# Patient Record
Sex: Female | Born: 1960 | Race: White | Hispanic: No | Marital: Married | Smoking: Never smoker
Health system: Southern US, Academic
[De-identification: ages and names within clinical notes are randomized; demographics above are authoritative.]

## PROBLEM LIST (undated history)

## (undated) DIAGNOSIS — E119 Type 2 diabetes mellitus without complications: Secondary | ICD-10-CM

## (undated) DIAGNOSIS — I10 Essential (primary) hypertension: Secondary | ICD-10-CM

## (undated) DIAGNOSIS — M129 Arthropathy, unspecified: Secondary | ICD-10-CM

## (undated) DIAGNOSIS — N189 Chronic kidney disease, unspecified: Secondary | ICD-10-CM

## (undated) HISTORY — PX: GALLBLADDER SURGERY: SHX652

## (undated) HISTORY — PX: HX BACK SURGERY: SHX140

---

## 1999-02-07 ENCOUNTER — Emergency Department (HOSPITAL_COMMUNITY): Payer: Self-pay

## 2007-02-12 IMAGING — MG MAMMO SCREEN W CAD
1 series · 4 of 4 positions shown · non-contrast
Comparison: 06/12/05 and 07/17/01.

------------- REPORT [id number] -------------
Broughton, Fouzia

[place], [place]
HISTORY: Asymptomatic 46 year-old with family history of breast cancer in her mother. Calculated lifetime breast cancer risk in this patient is 17% compared with 12% for control group.

[Series 2: R CC · right · 4 of 4 slices shown]
[im 1/4]
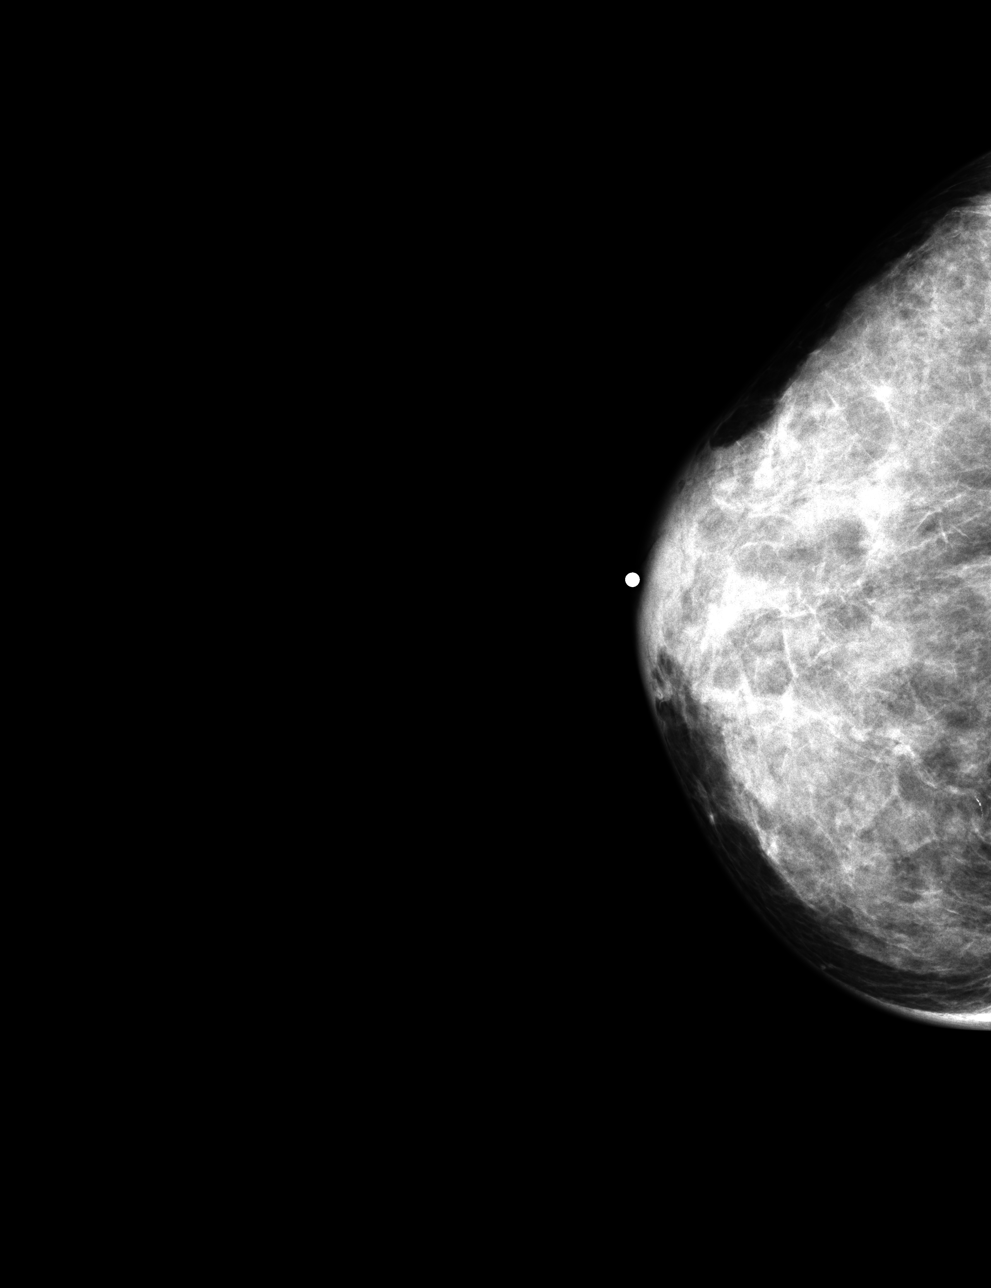
[im 2/4]
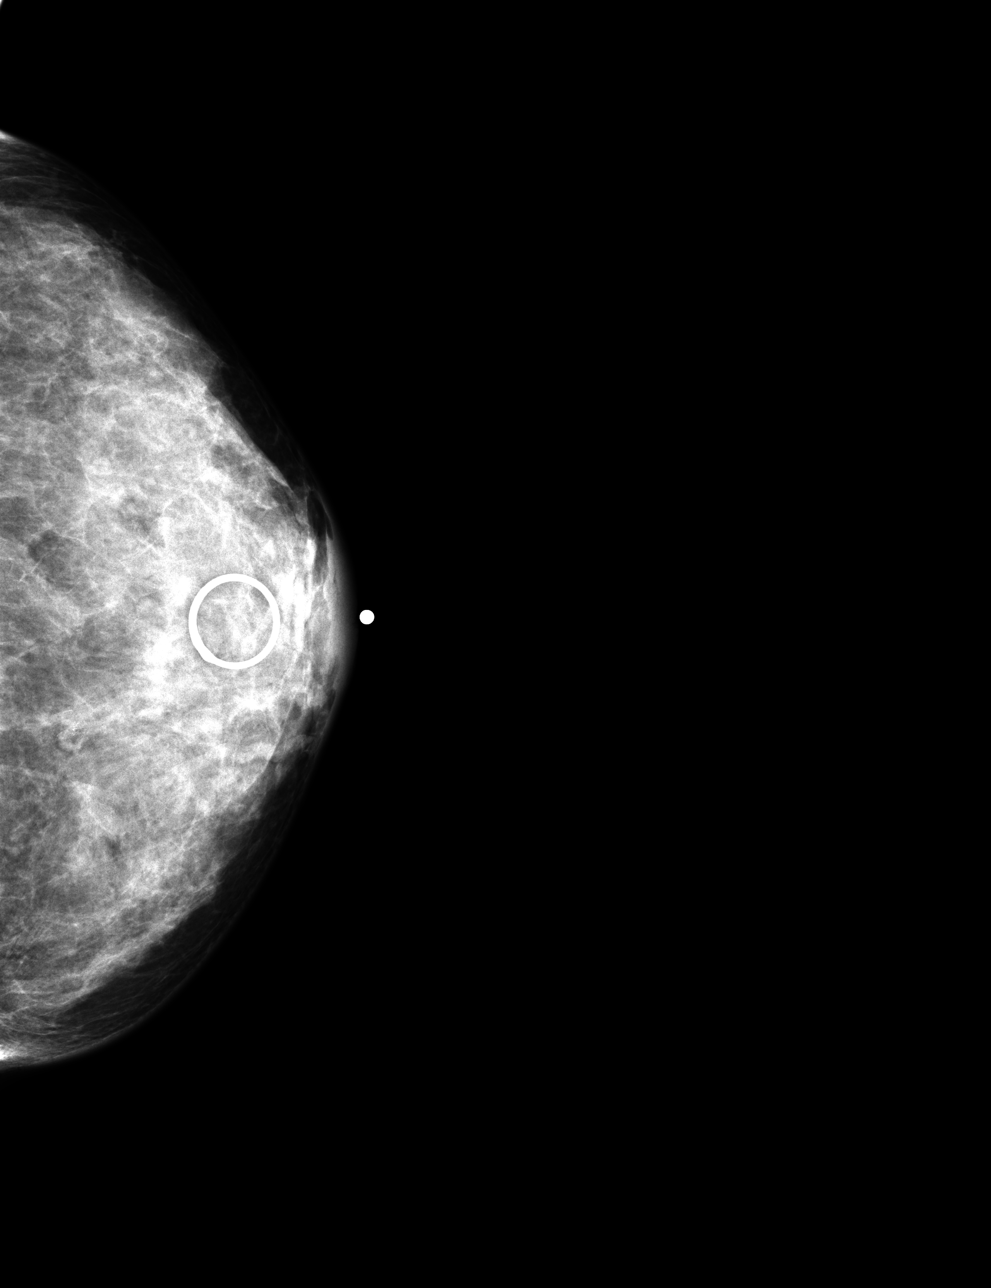
[im 3/4]
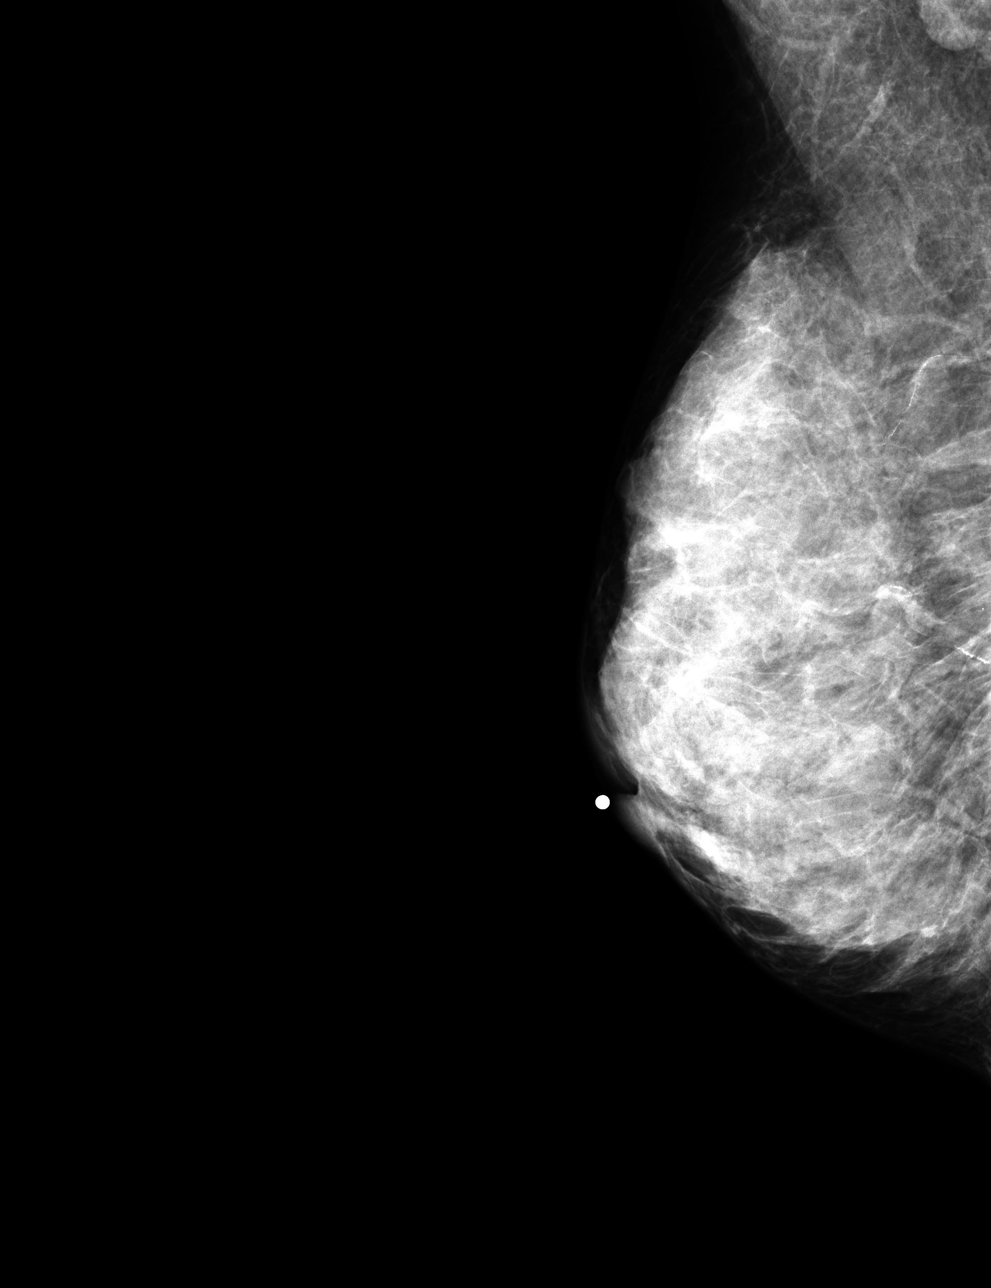
[im 4/4]
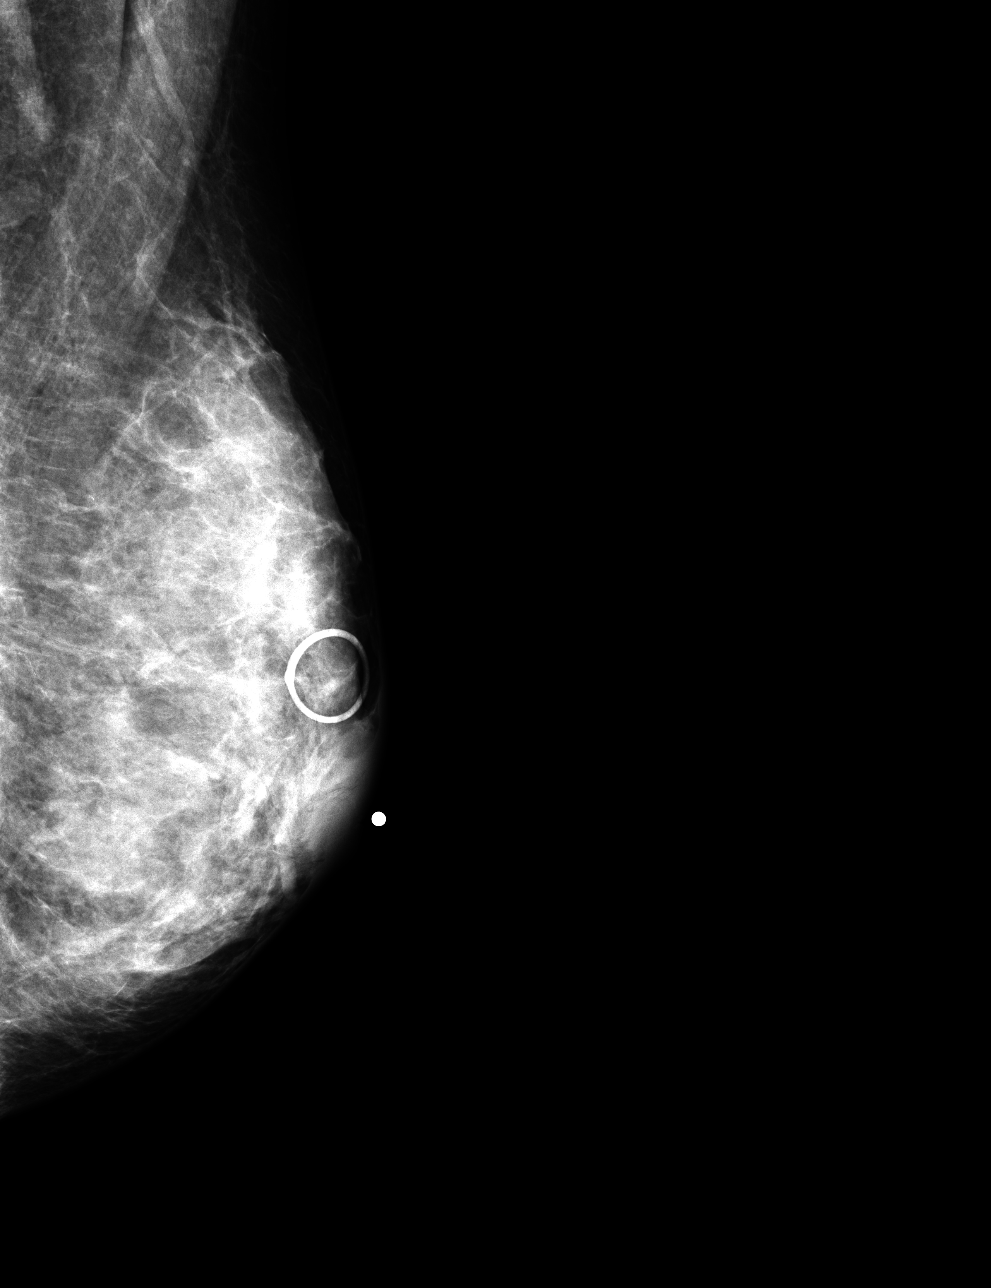

[4 of 4 positions shown; findings below may reference images not displayed]

FINDINGS: Bilaterally dense breasts limits sensitivity to the mammogram. No well defined masses or architectural changes are seen. Benign vascular calcifications of the right breast are stable in appearance. No skin changes or nipple changes are seen. 
NOTE:
In compliance with Federal regulations, the results of this mammogram are being sent to the patient.
IMPRESSION: Bilaterally dense breasts limits sensitivity to the mammogram. Mammographic findings nevertheless are stable from previous studies. Please correlate with clinical breast exam findings. 
Clinical follow up and mammographic follow up at twelve months are recommended. 

Final Assessment Code:
Bi-Rads 2 

BI-RADS 0
Need additional imaging evaluation
BI-RADS 1
Negative mammogram
BI-RADS 2
Benign finding
BI-RADS 3
Probably benign finding: short-interval follow-up suggested
BI-RADS 4
Suspicious abnormality:  biopsy should be considered
BI-RADS 5
Highly suggestive of malignancy; appropriate action should be taken

________________________________
Dele Tiger., signed this document electronically

------------- REPORT [id number] -------------
Caudle, Joscelyn
[id number] 

Normal/Negative.  No evidence of cancer.

Interpreting Radiologist:

Boniface Aujla, M.D.

Annual Breast Examination by a physician or other health care provider
Annual Mammography Screening beginning at age 40
Monthly Breast Self Examination

## 2008-03-02 IMAGING — MG MAMMO SCREEN W CAD
1 series · 4 of 4 positions shown · non-contrast
Comparison: 02/12/2007.

Noyola, Nefi

SCREENING BILATERAL MAMMOGRAM, 03/02/2008
HISTORY: Screening.
TECHNIQUE: Digital mammography was performed with CAD.

[Series 2: R CC · right · 4 of 4 slices shown]
[im 1/4]
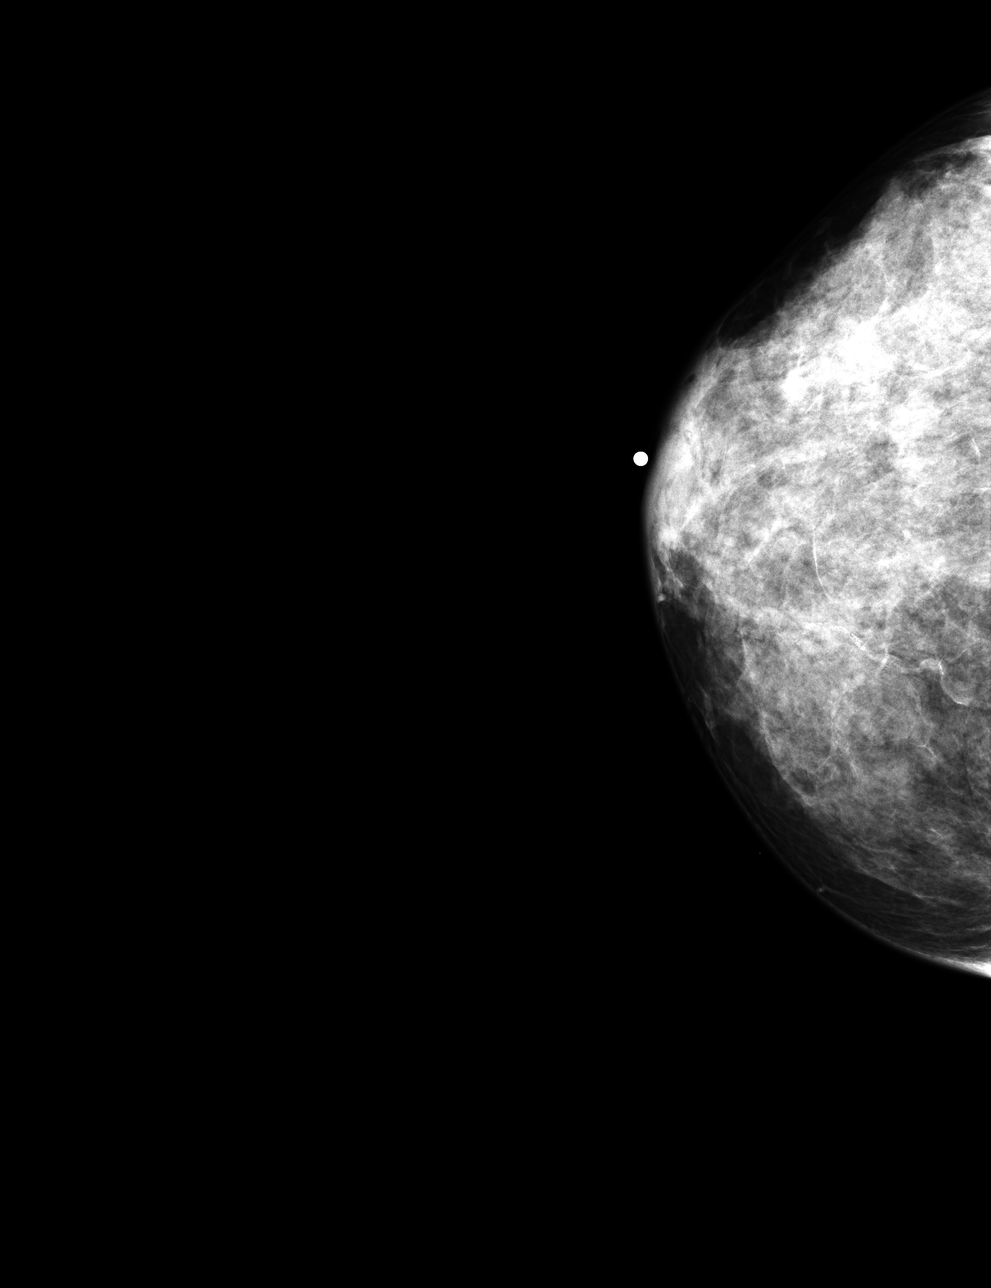
[im 2/4]
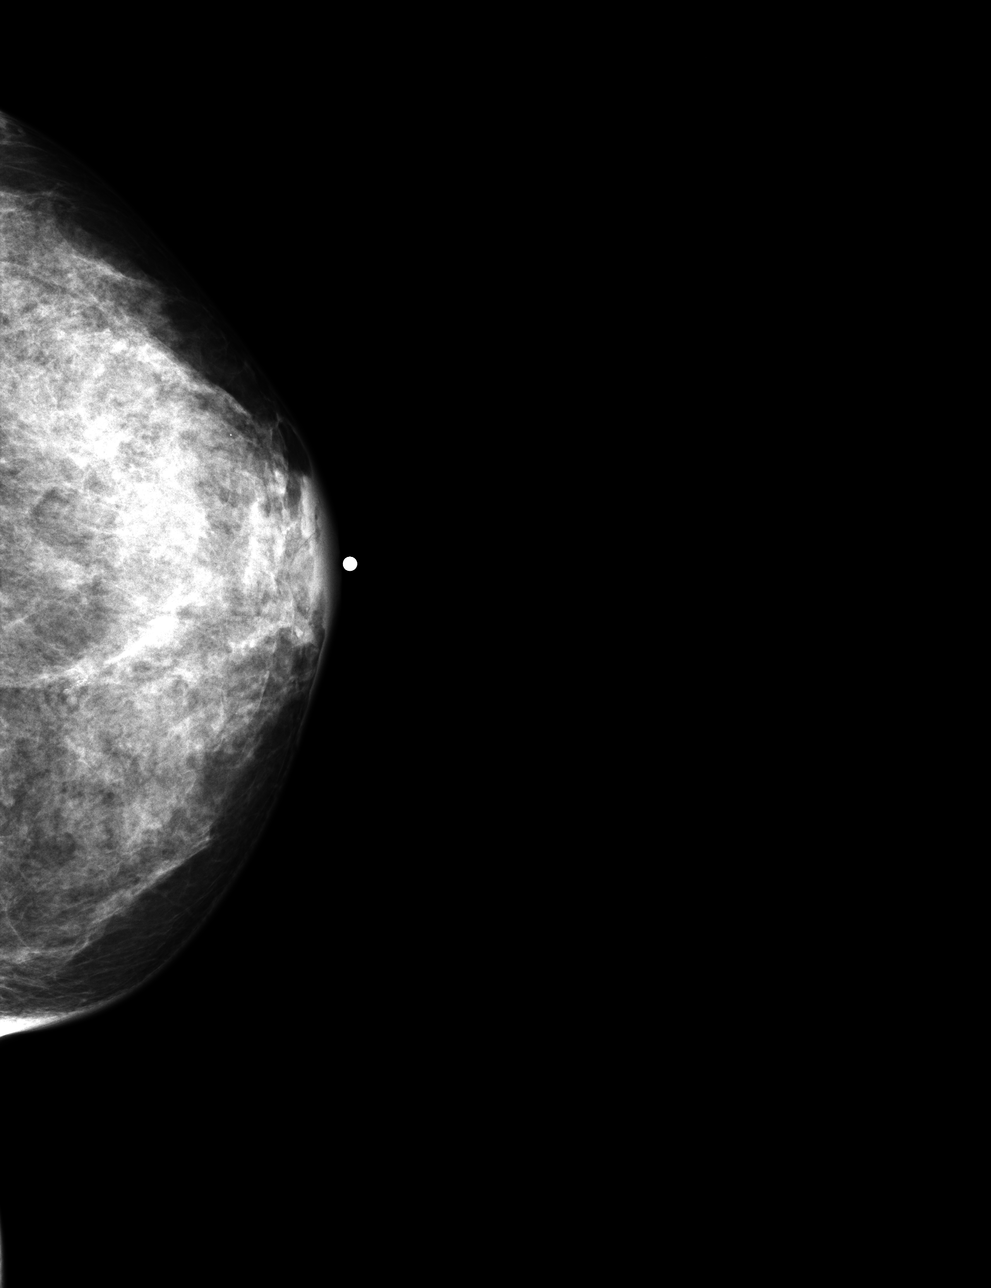
[im 3/4]
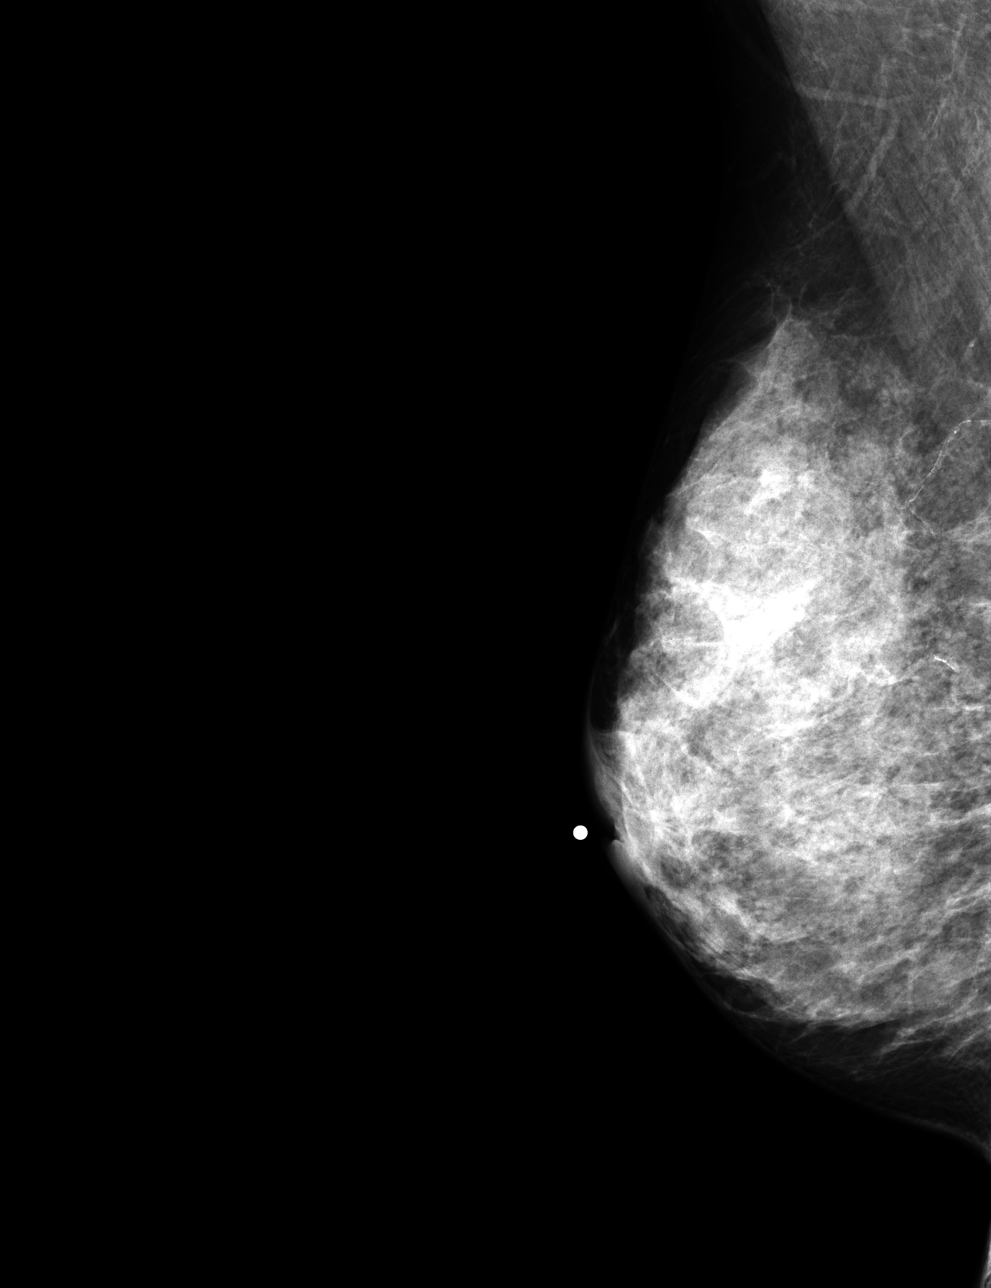
[im 4/4]
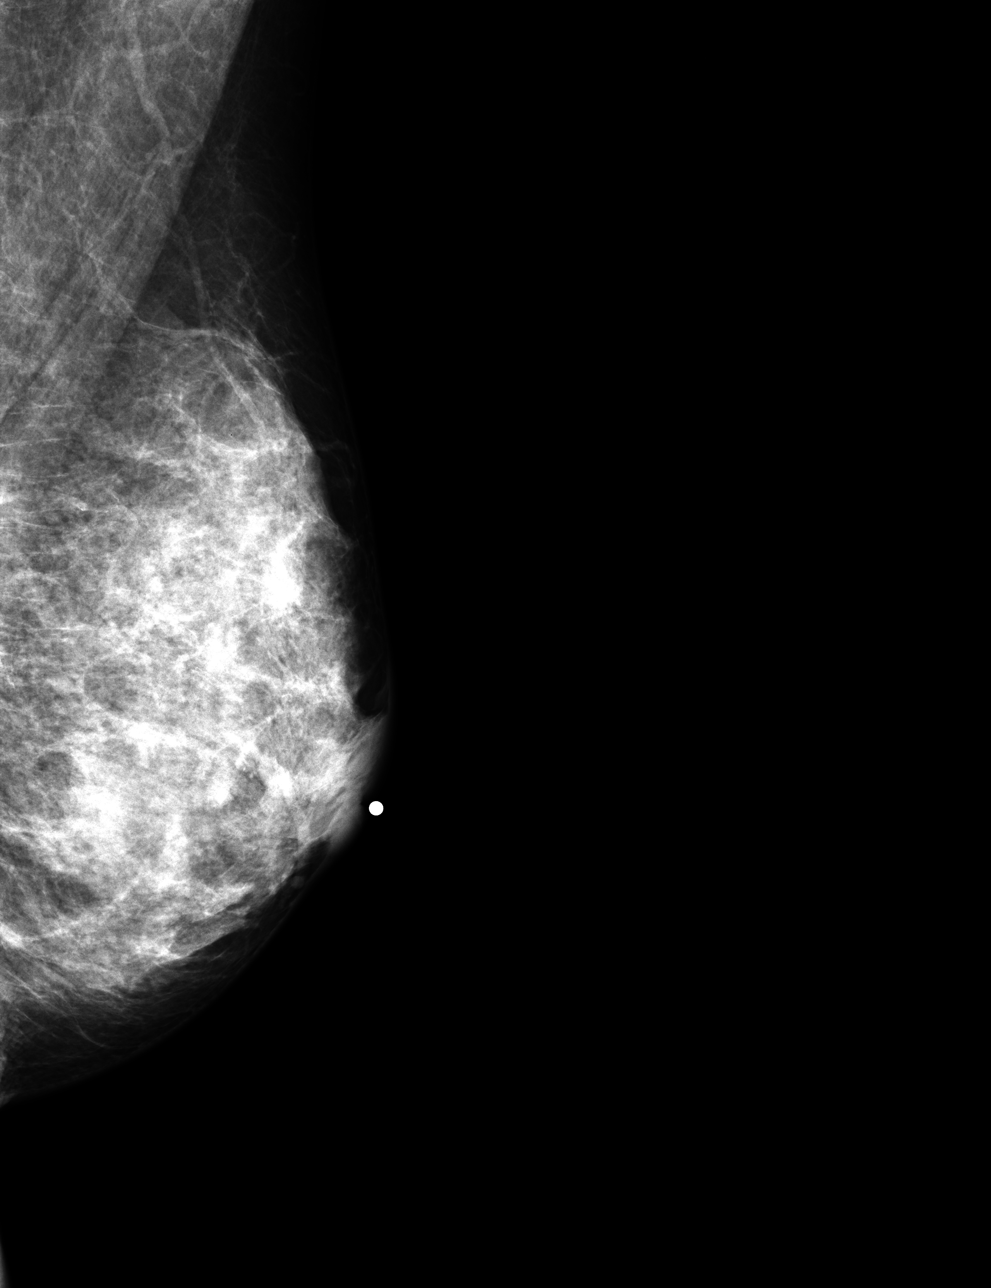

[4 of 4 positions shown; findings below may reference images not displayed]

FINDINGS: There is dense breast parenchyma that lowers the sensitivity of mammography for the detection of breast cancer.  The parenchyma is seen in a roughly symmetrical distribution.  Benign appearing calcifications are noted bilaterally.  No suspicious microcalcifications, malignant appearing masses, or spiculated lesions are seen.  No significant change is evident compared to prior films dated  
NOTE:
In compliance with Federal regulations, the results of this mammogram are being sent to the patient.
IMPRESSION: No radiographic evidence of malignancy.
Final Overall Assessment Category:

Bi-Rads 2:  Benign.

BI-RADS 0
Need additional imaging evaluation
BI-RADS 1
Negative mammogram
BI-RADS 2
Benign finding
BI-RADS 3
Probably benign finding: short-interval follow-up suggested
BI-RADS 4
Suspicious abnormality:  biopsy should be considered
BI-RADS 5
Highly suggestive of malignancy; appropriate action should be taken. 

________________________________

## 2009-01-11 IMAGING — US GYN
1 series · 14 of 16 positions shown · non-contrast
Comparison: none

Libre, Abdemalik

EXAM:
PELVIC ULTRASOUND
HISTORY: Anemia
TECHNIQUE: Transabdominal and transvaginal examination were performed.

[Series 1: gyn · 0.33mm/px · 14 of 87 slices shown]
[im 1/87]
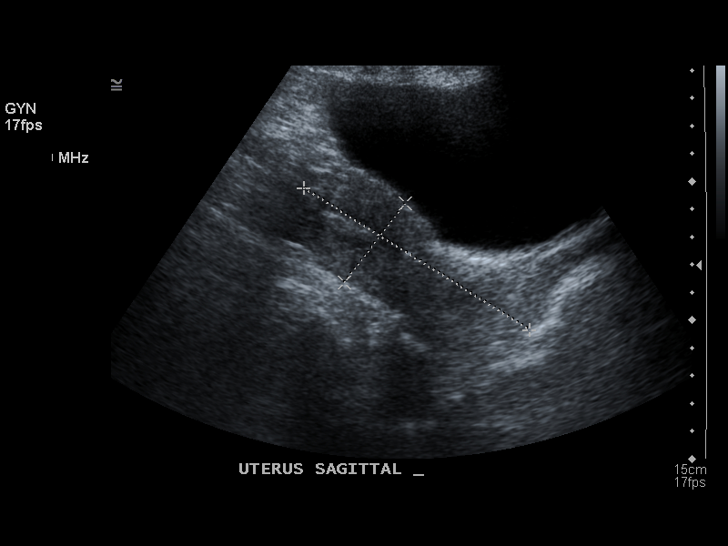
[im 6/87]
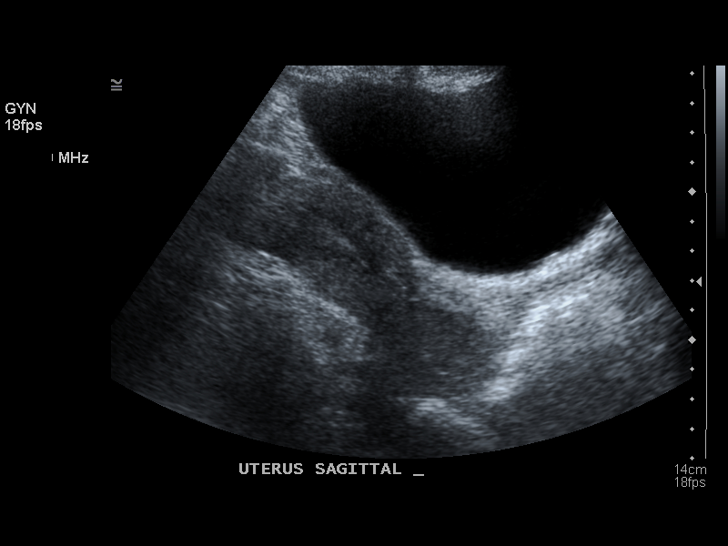
[im 12/87]
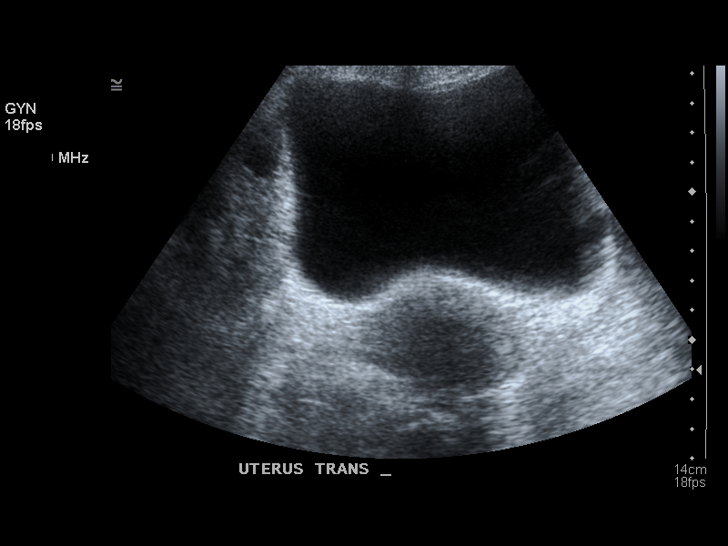
[im 23/87]
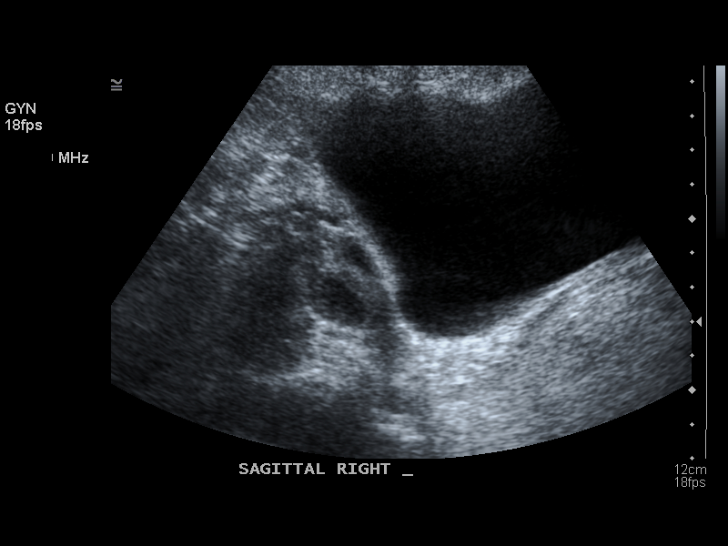
[im 29/87]
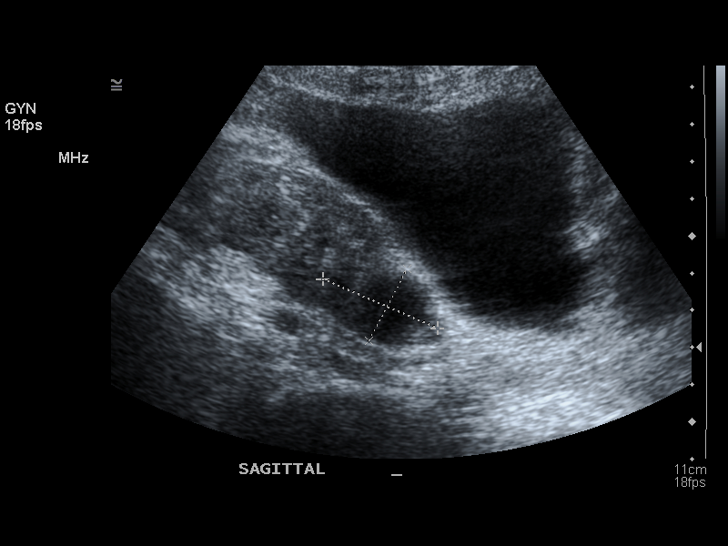
[im 35/87]
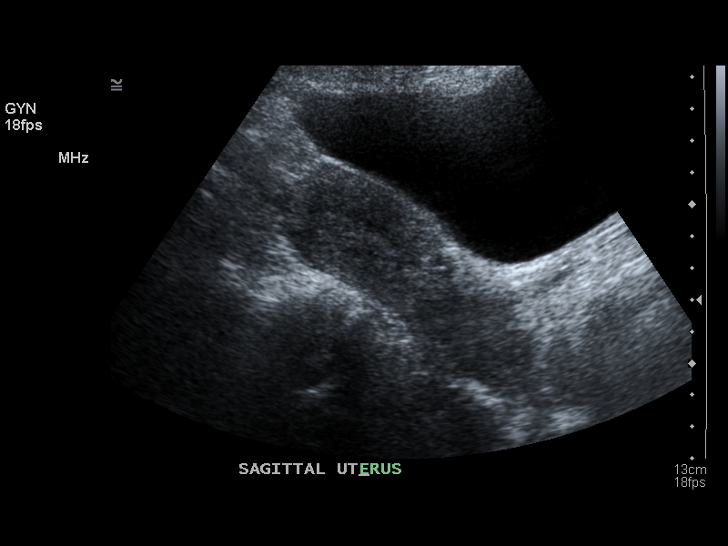
[im 41/87]
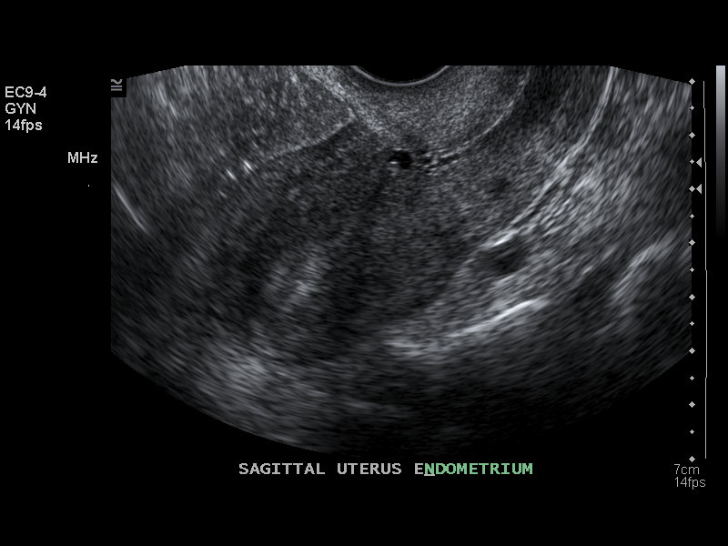
[im 46/87]
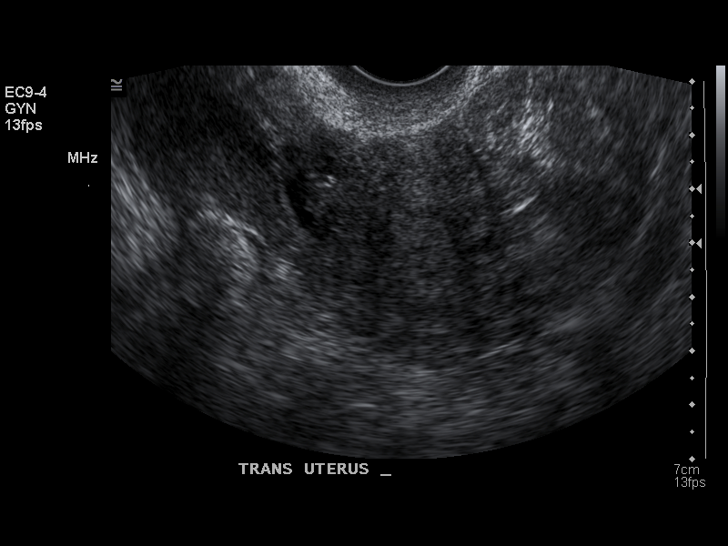
[im 52/87]
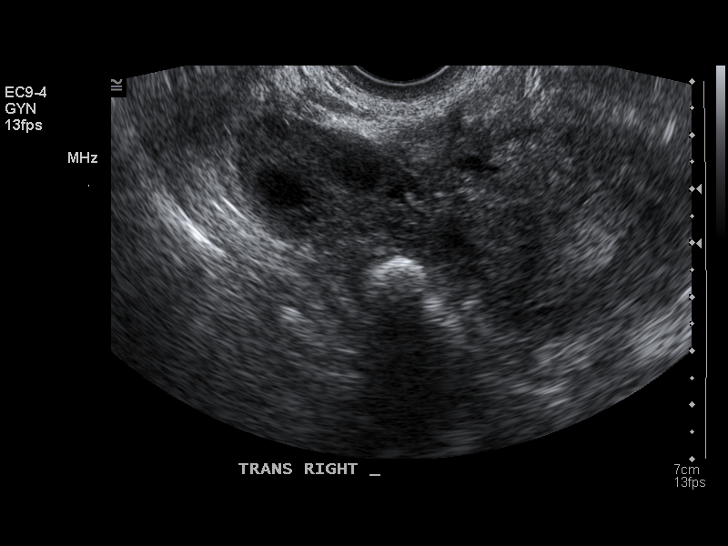
[im 58/87]
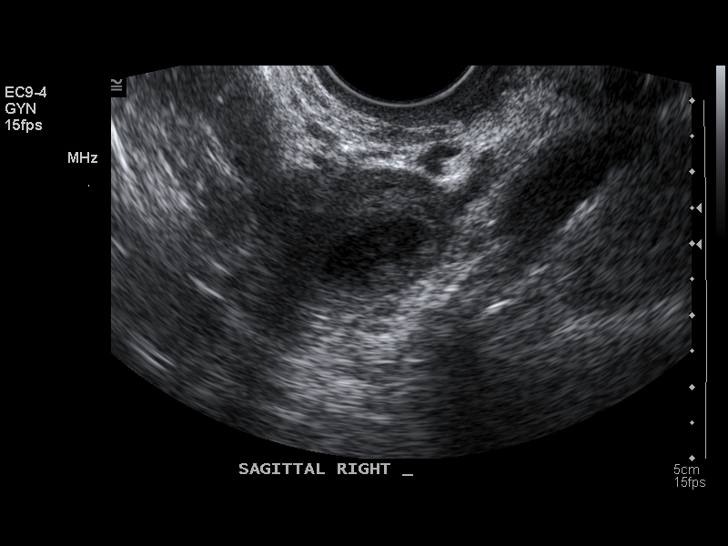
[im 69/87]
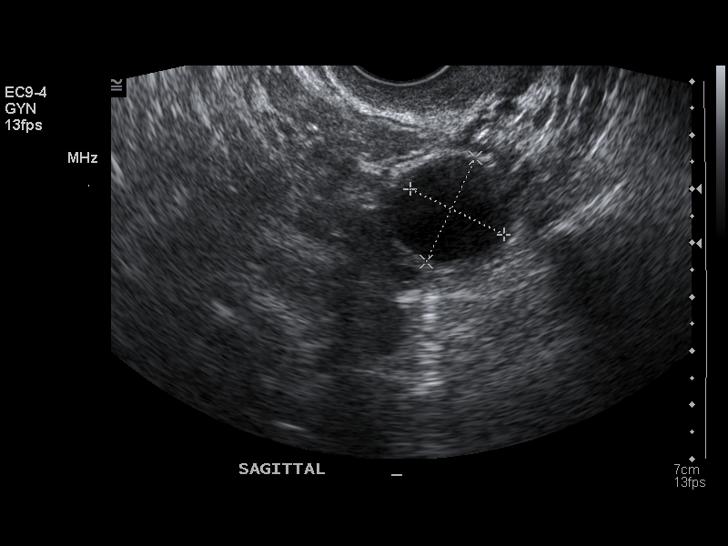
[im 75/87]
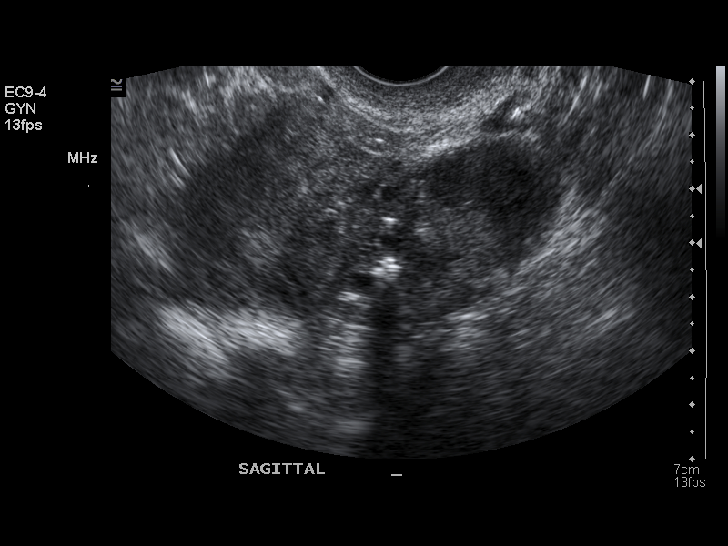
[im 81/87]
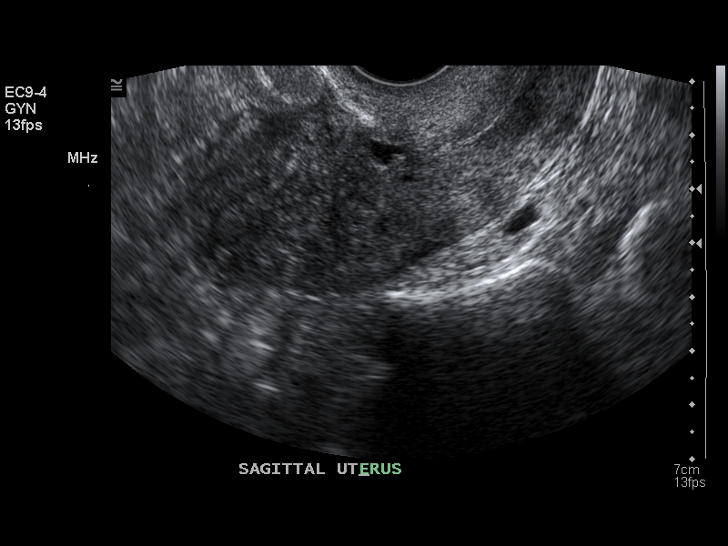
[im 87/87]
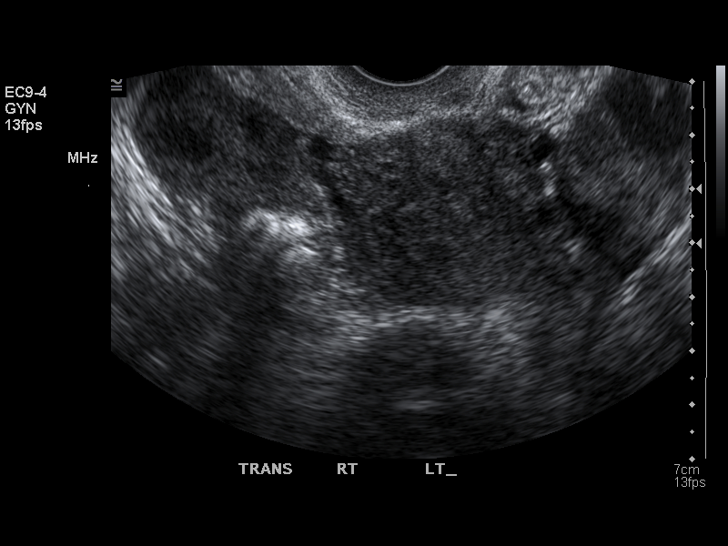

[14 of 16 positions shown; findings below may reference images not displayed]

FINDINGS: The uterus appears normal in overall size with a normal-appearing endometrium.  A probable small 8 mm fibroid is noted in the uterine fundus. 

Both ovaries appear within normal limits. No adnexal masses or free pelvic fluid is seen.
IMPRESSION: Small uterine fibroid. 
Otherwise unremarkable examination. 

________________________________

## 2009-04-06 IMAGING — MG MAMMO SCREEN W CAD
1 series · 4 of 4 positions shown · non-contrast
Comparison: Mammograms of 02/12/2007 and 03/02/2008.

Nausad, Mdnisad

Binod, Favaz
EXAM:
BILATERAL DIGITAL SCREENING MAMMOGRAM WITH COMPUTER-ASSISTED DIAGNOSIS
HISTORY: Asymptomatic 48-year-old with strong family history of breast cancer in her mother and sister.  Lifetime breast cancer risk in this patient is calculated at 33% compared with 12% in controlled group.

[Series 2: R CC · right · 4 of 4 slices shown]
[im 1/4]
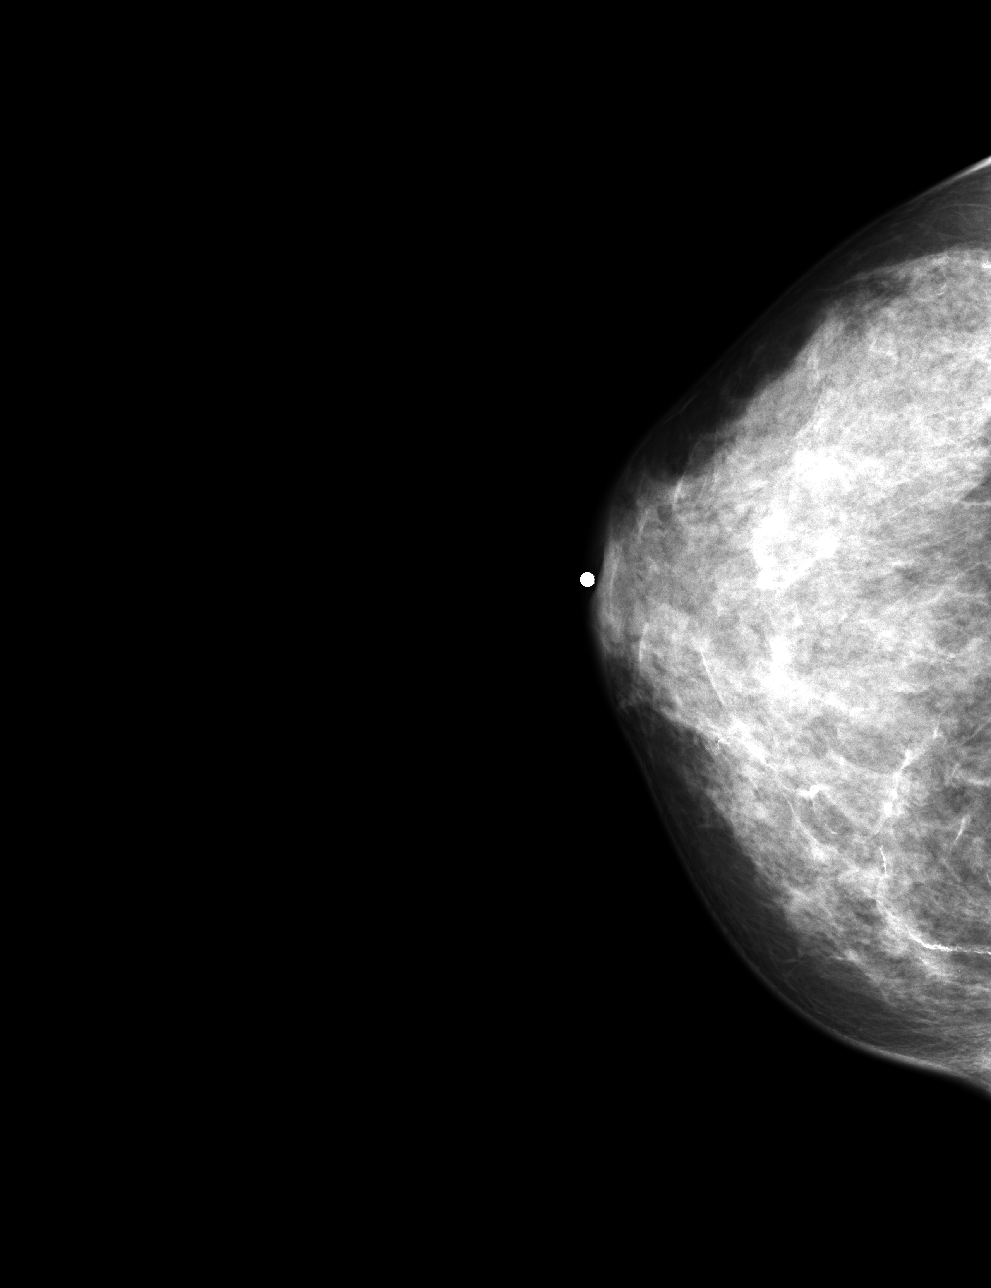
[im 2/4]
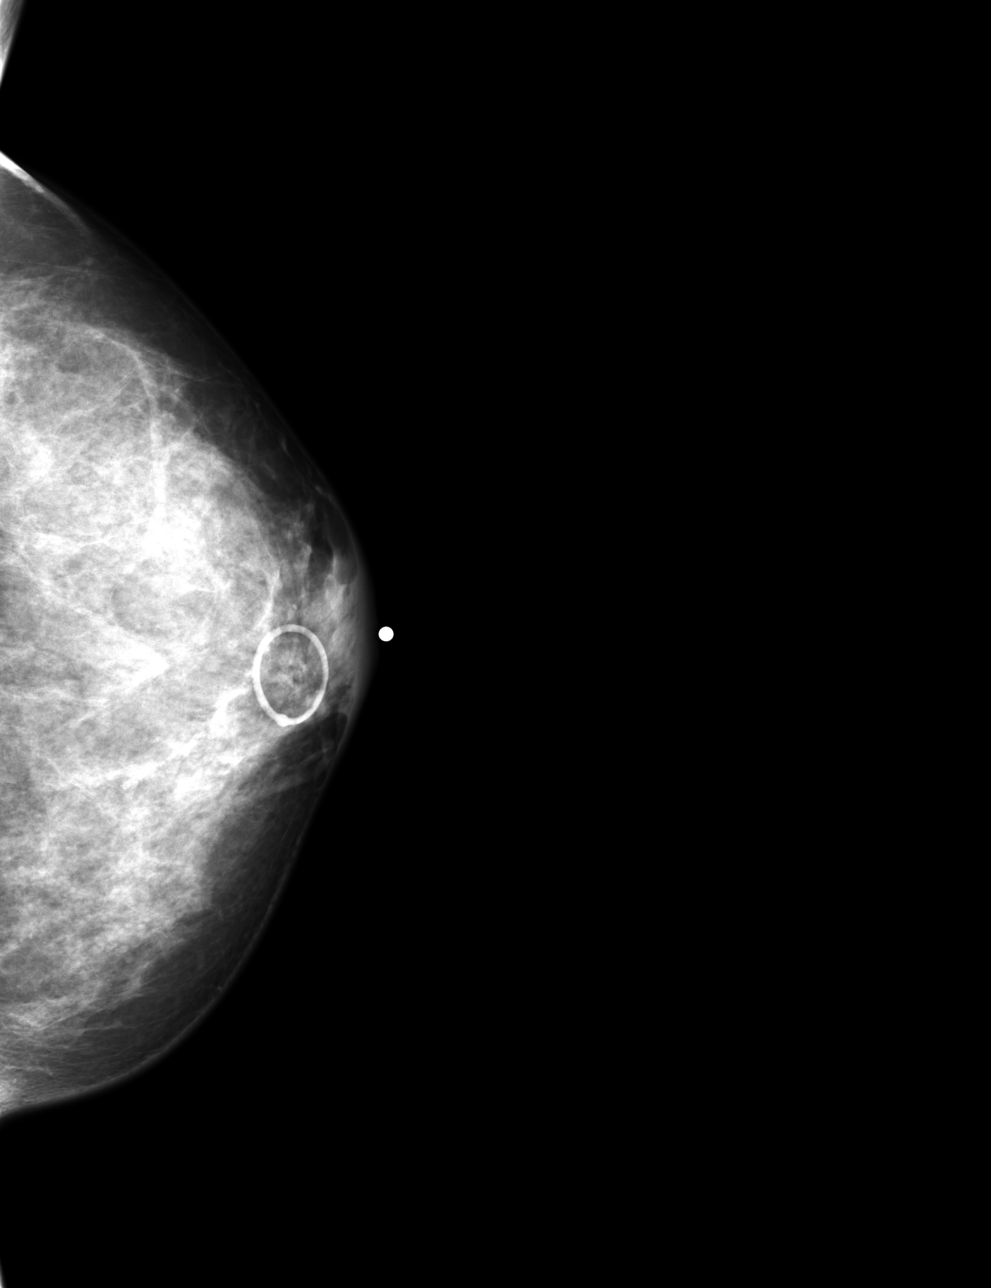
[im 3/4]
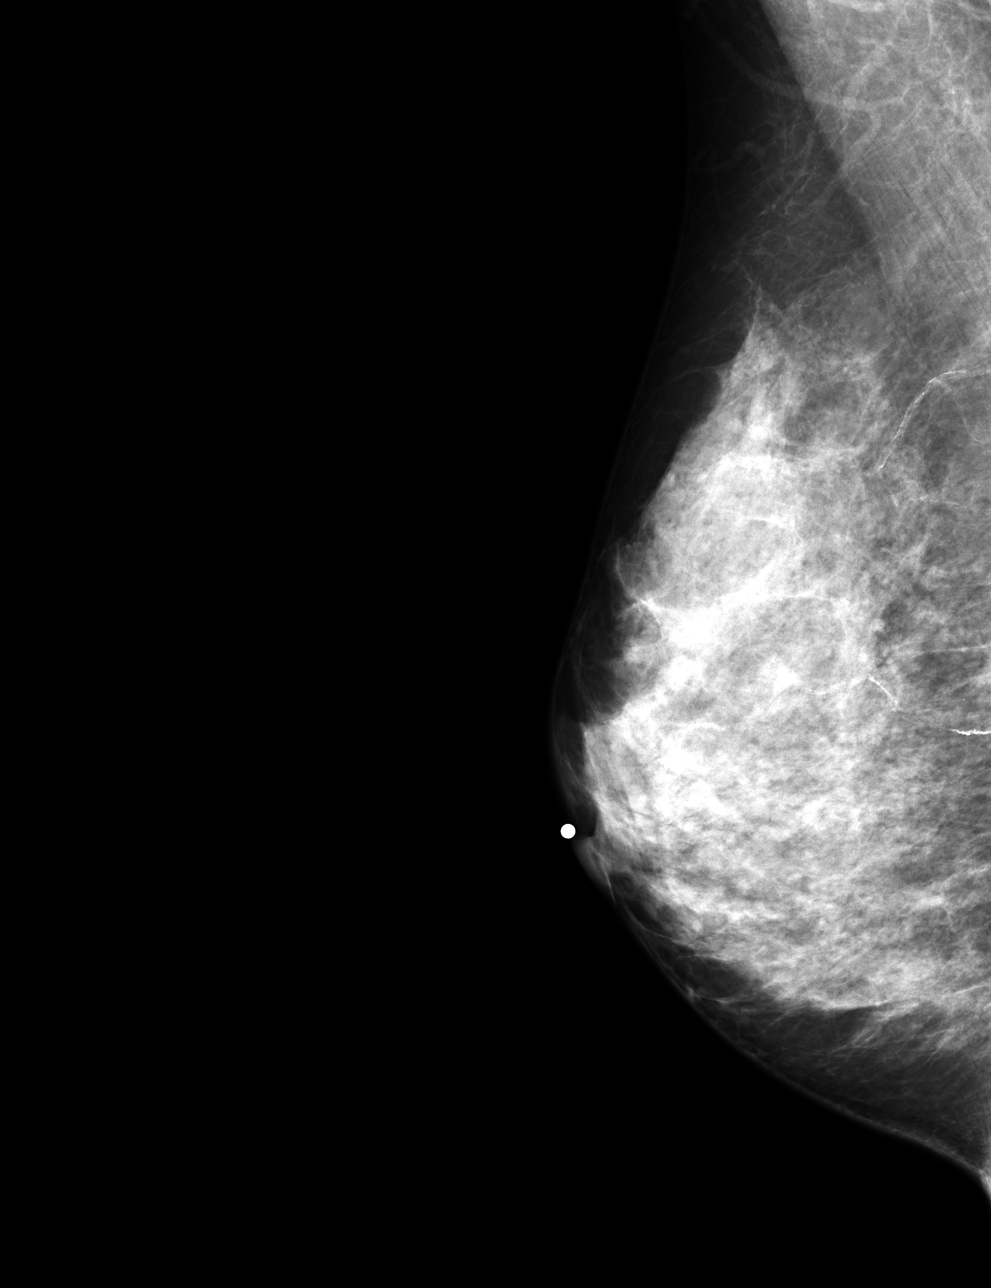
[im 4/4]
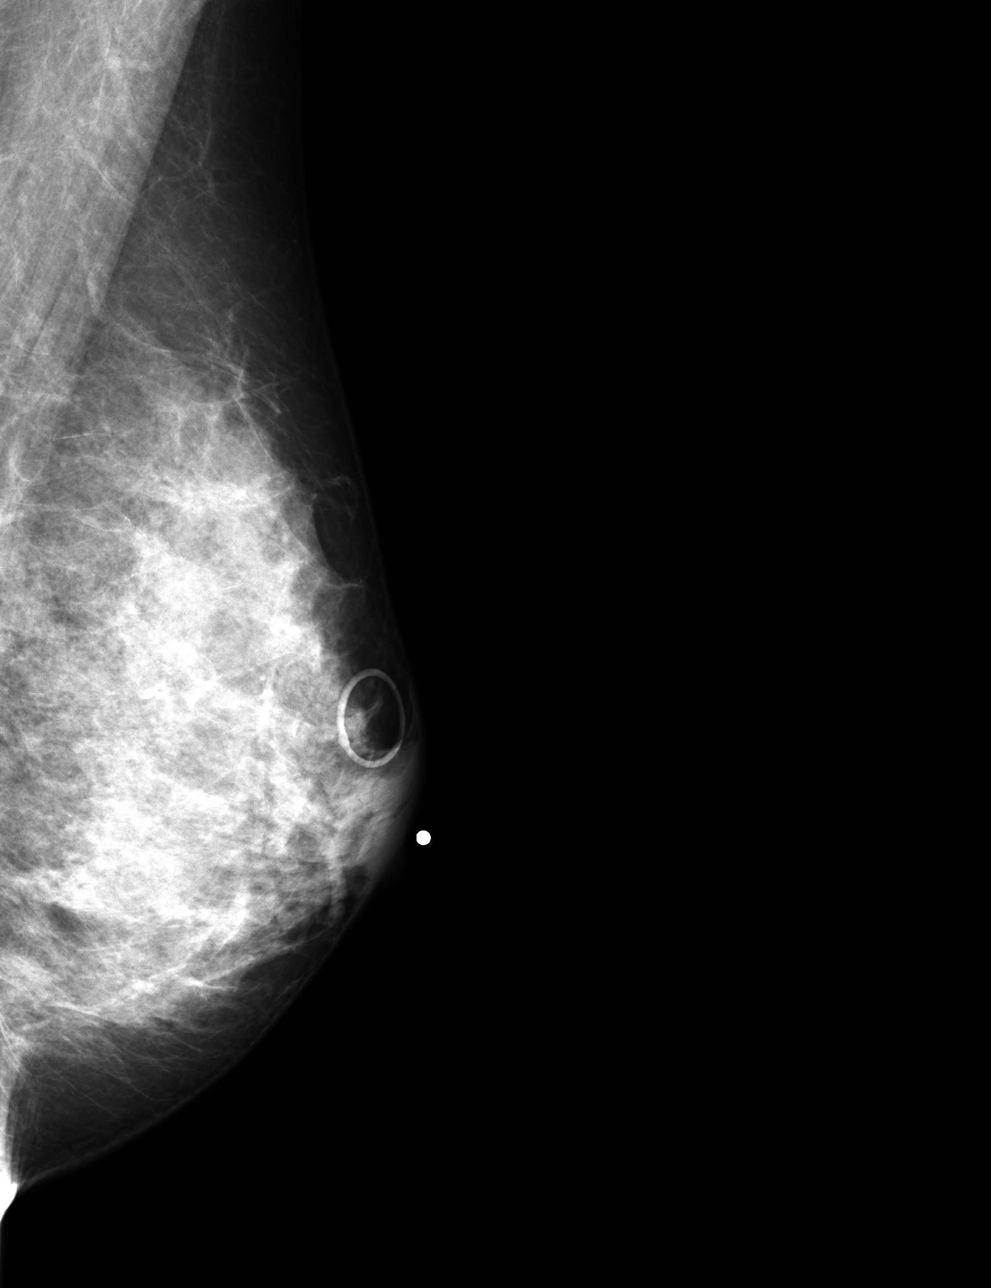

[4 of 4 positions shown; findings below may reference images not displayed]

FINDINGS: Significantly dense breasts limit the sensitivity to mammogram in this patient.  No well defined masses or architectural changes are seen.  Vascular calcifications of both breasts are stable.  No skin changes or nipple changes are seen.  

NOTE:

In compliance with Federal regulations, the results of this mammogram are being sent to the patient.
IMPRESSION: Final Assessment Code:

BI-RADS 2:
BI-RADS 0
Need additional imaging evaluation
BI-RADS 1
Negative mammogram
BI-RADS 2
Benign finding
BI-RADS 3
Probably benign finding - short interval follow-up suggested
BI-RADS 4
Suspicious abnormality: biopsy should be considered
BI-RADS 5
Highly suggestive of malignancy; appropriate action should be taken

________________________________

## 2009-09-13 IMAGING — CT NECK^NECK_WITH (ADULT)
3 series · 16 of 33 positions shown, 19 images · IV contrast (APPLIED)
Comparison: none

Motaki, Kbor

Azagra, Dieusson
EXAM:
CT OF THE NECK WITH CONTRAST
INDICATION: Supraclavicular edema and right-sided neck pain.  
CONTRAST:
75 ml Optiray 300.

[Series 3: neck w axial · axial · 0.52mm/px · z∈[-239,-45]mm · 8 of 115 slices shown, 10 images]
[im 9/115  soft-tissue]
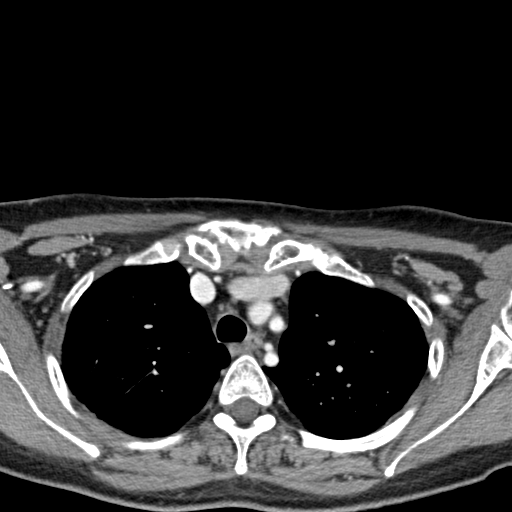
[im 9/115  bone]
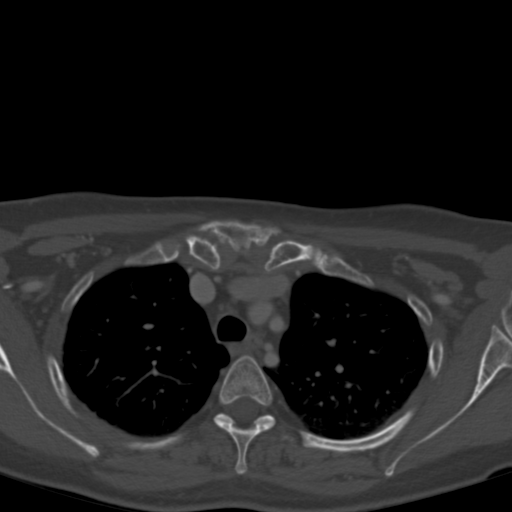
[im 27/115  bone]
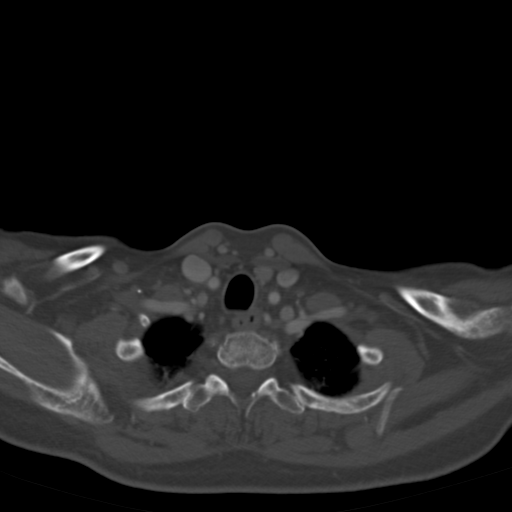
[im 36/115  bone]
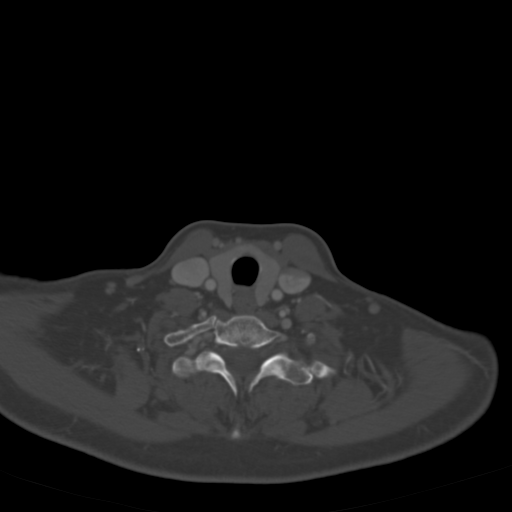
[im 53/115  bone]
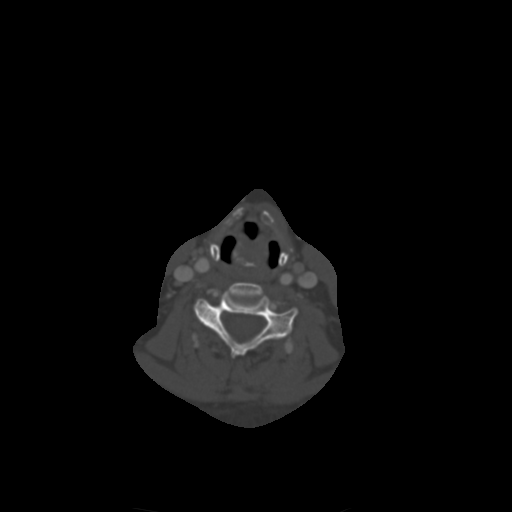
[im 62/115  soft-tissue]
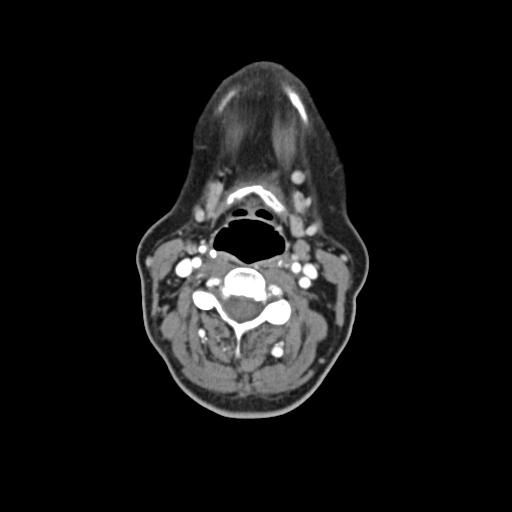
[im 62/115  bone]
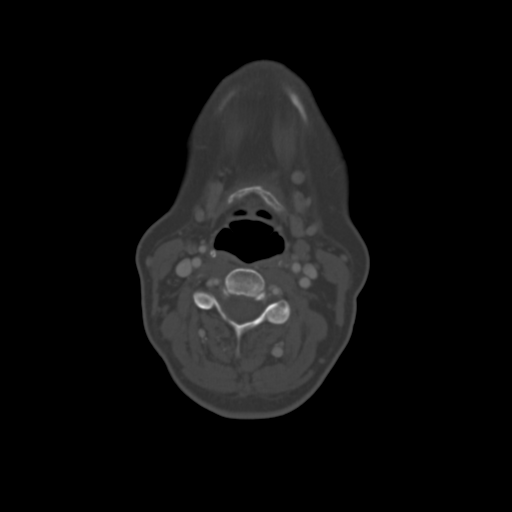
[im 79/115  bone]
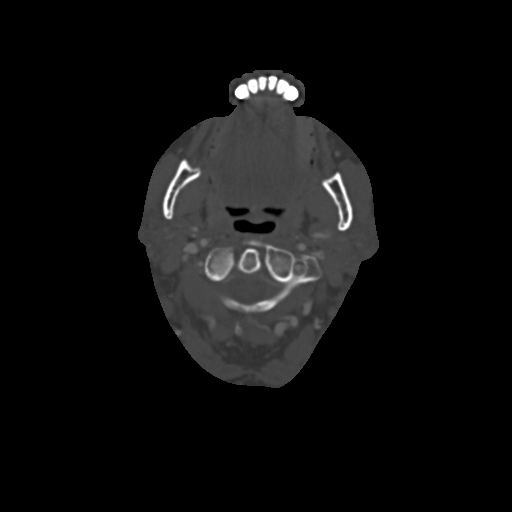
[im 88/115  bone]
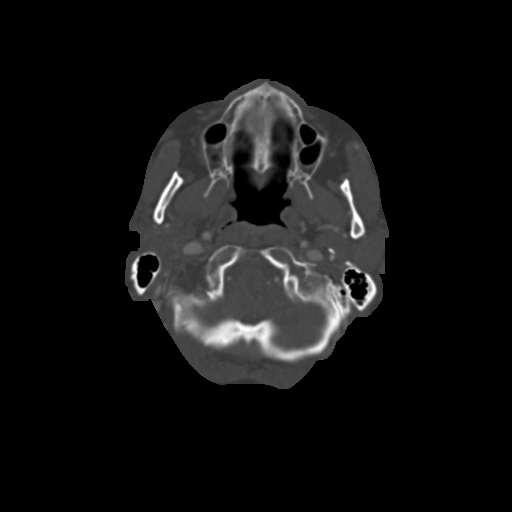
[im 106/115  bone]
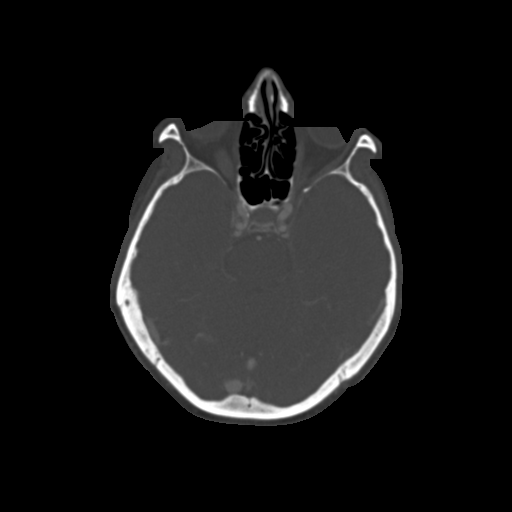

[Series 4: neck sag · sagittal · 0.59mm/px · 5 of 65 slices shown, 6 images]
[im 22/65  bone]
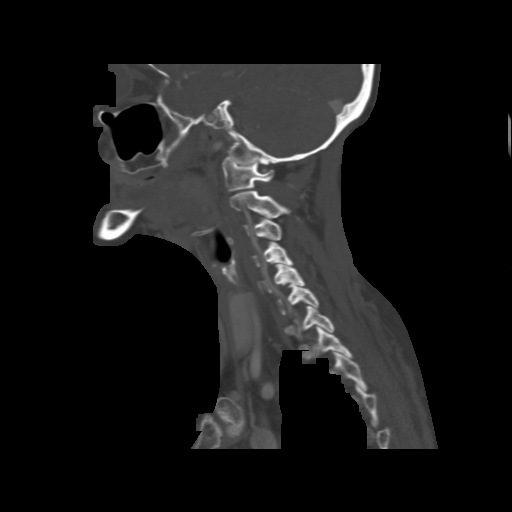
[im 27/65  bone]
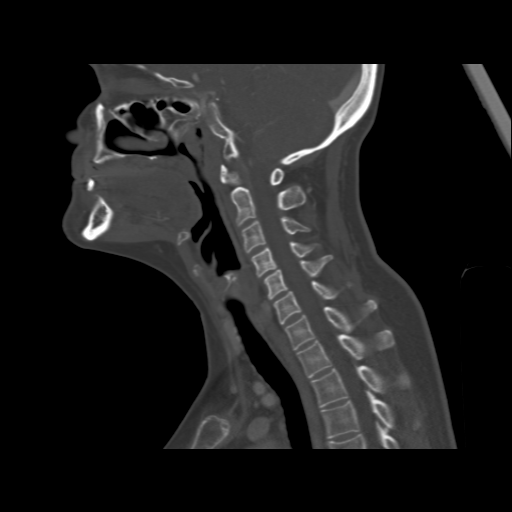
[im 33/65  soft-tissue]
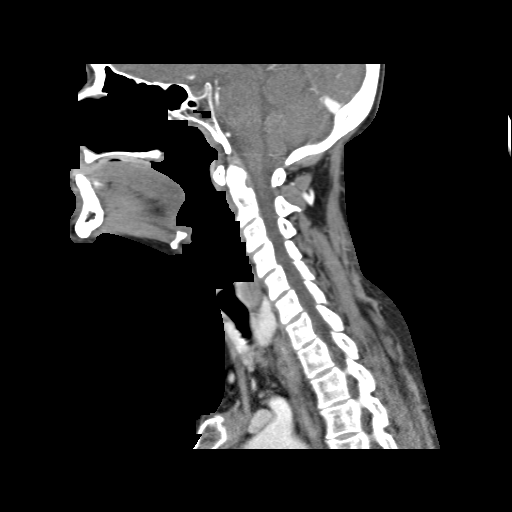
[im 33/65  bone]
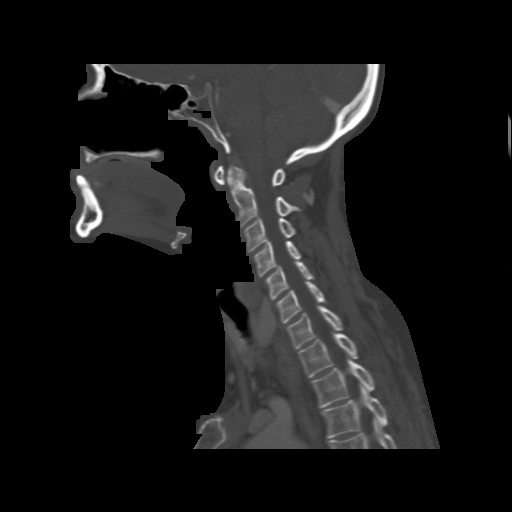
[im 38/65  bone]
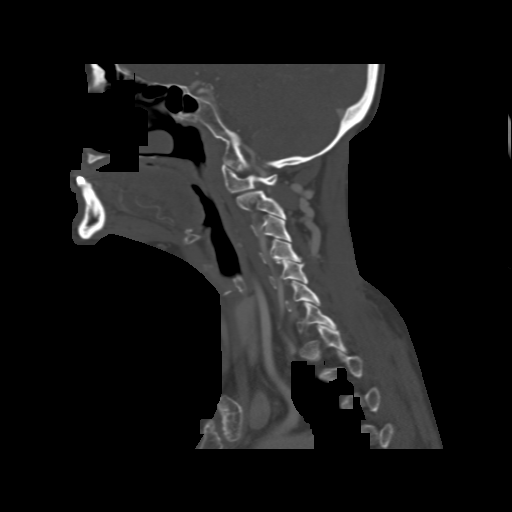
[im 43/65  bone]
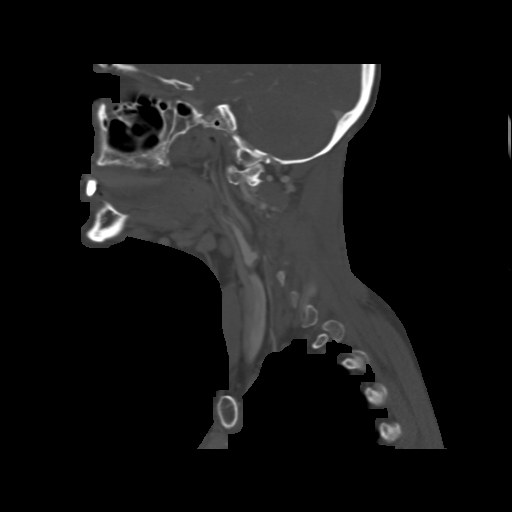

[Series 502: neck cor · coronal · 0.70mm/px · 3 of 79 slices shown]
[im 16/79  bone]
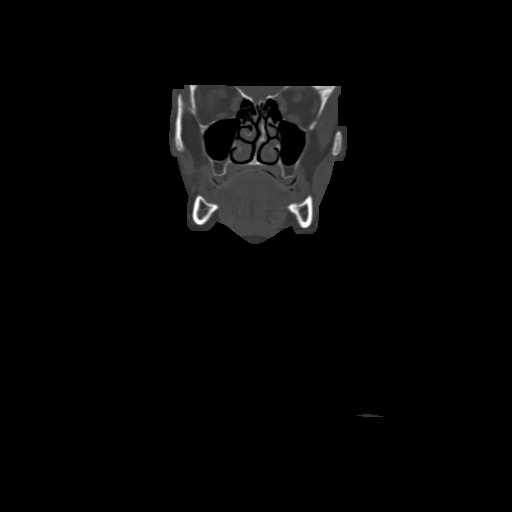
[im 32/79  bone]
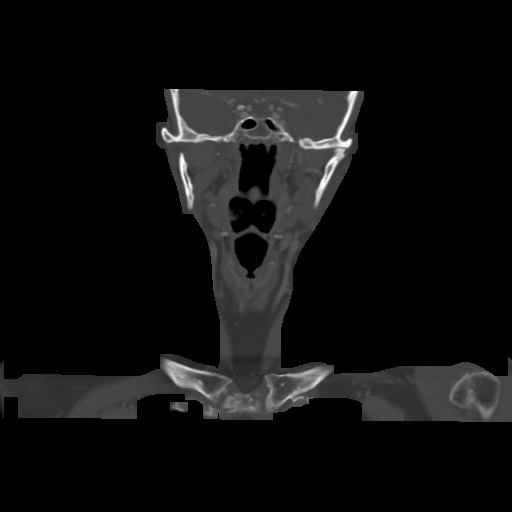
[im 47/79  bone]
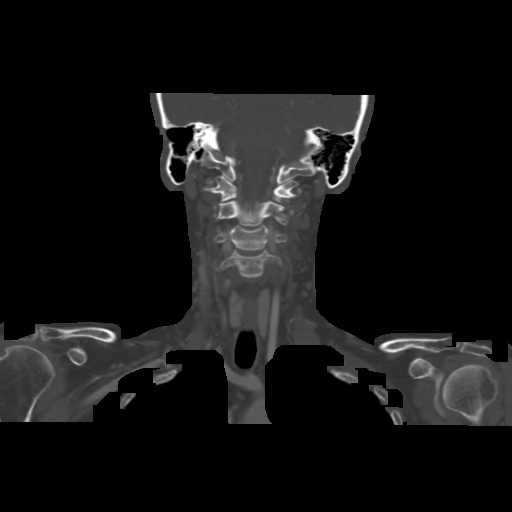

[16 of 33 positions shown; findings below may reference images not displayed]

FINDINGS: There is no acute cervical spine fracture or subluxation.  The vertebral bodies are normal in alignment and height.  Intervertebral disc spaces are fairly well maintained.  Though the study is not optimized for visualization of the spinal cord, there is no definite significant disc disease, canal stenosis, or foraminal narrowing.  

Evaluation of the soft tissues demonstrates scattered lymph nodes in the submandibular region extending into the jugulodigastric chains bilaterally.  The largest of these is in the right jugulodigastric, level II distribution, measuring 8 mm, and not pathology enlarged by size criteria.  

There is no significant soft tissue infiltration or collections.  No soft tissue masses are noted within the visceral space.  

Visualized thyroid gland is unremarkable.  

Parapharyngeal fat planes are well preserved.  

There is a four vessel branching pattern from the aortic arch with direct origin of the left vertebral artery.  Visualized carotid and vertebral systems are unremarkable with no evidence for significant stenosis or aneurysm.  

There is limited evaluation of the superior vena cava, but this appears grossly patent.  No significant collateral circulation is seen.
IMPRESSION: Scattered, non-pathologically enlarged lymph nodes bilaterally within the neck, the largest of which measures 8 mm in the right level II jugulodigastric distribution.  No dominant mass or collection is seen in the neck. 
Limited evaluation of cervical spine, canal, and foramina, however, no gross disc pathology or canal stenosis is seen.  
________________________________

## 2010-05-23 IMAGING — MG MAMMO SCREEN W CAD
1 series · 4 of 4 positions shown · non-contrast
Comparison: 04/06/09 and 03/02/08.

Rom, Faby

Hamann, Loubensky
INDICATION: Screening
ADDITIONAL HISTORY:
Patient denies previous breast surgery. She denies any current problems with her breasts. 
RISK FACTOR:
Positive family history of breast cancer (mother and sister).
TECHNIQUE: Bilateral MLO and CC digital images were obtained. This study was reviewed using iCAD 200 software.

[Series 2: R CC · right · 4 of 4 slices shown]
[im 1/4]
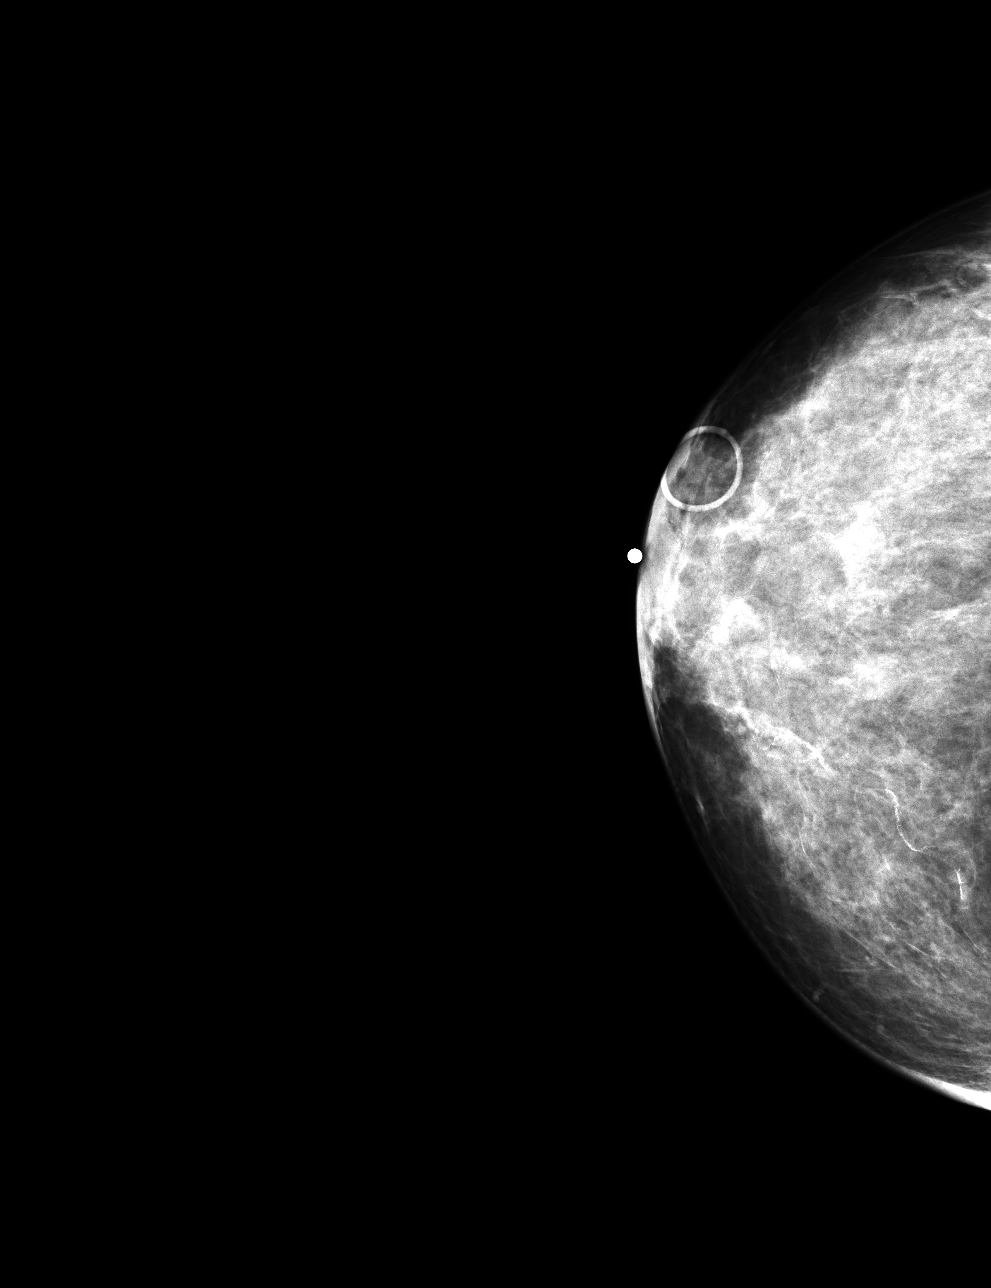
[im 2/4]
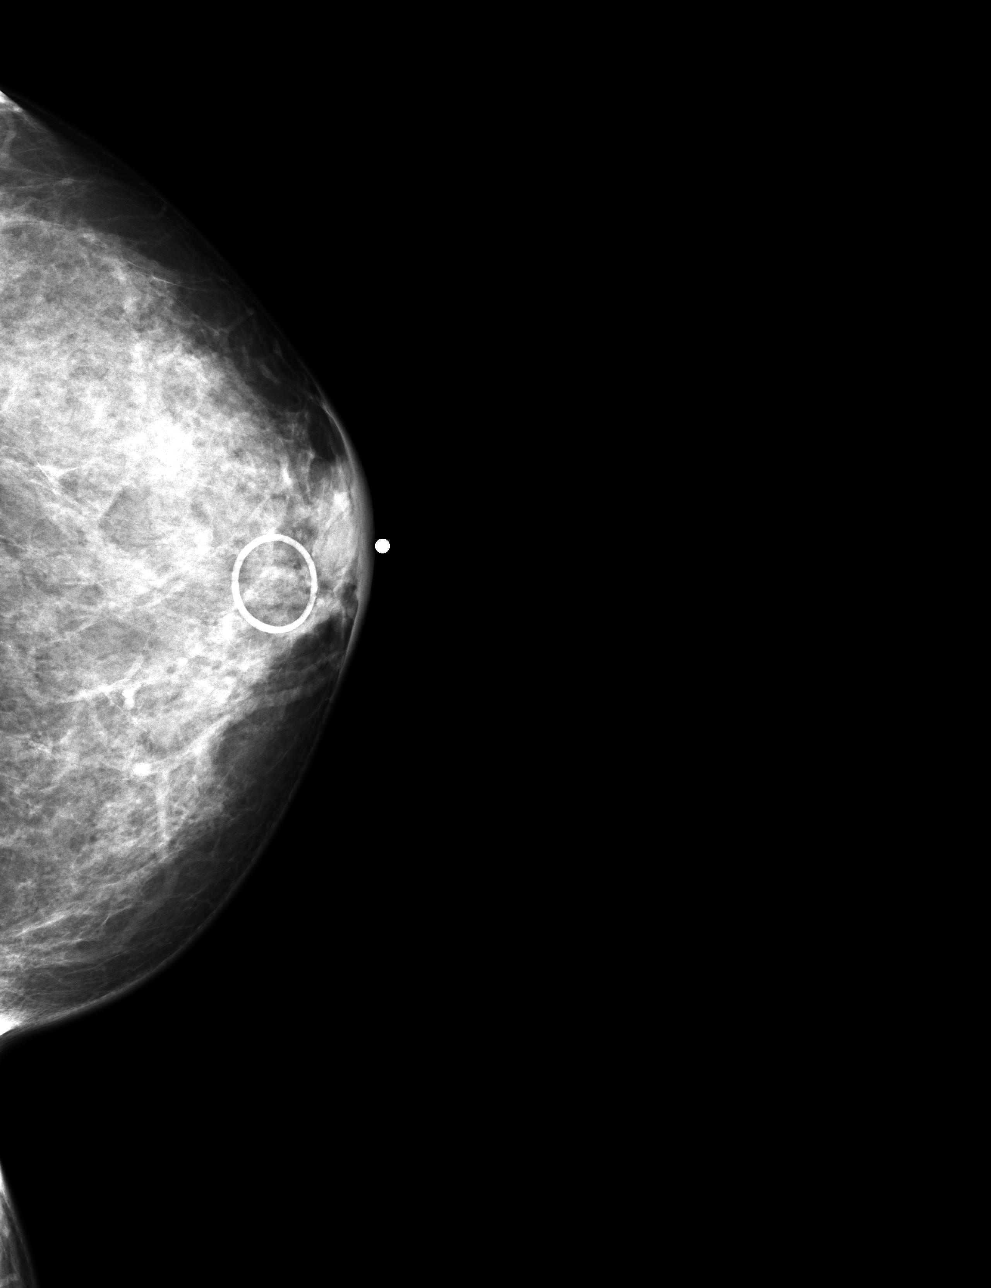
[im 3/4]
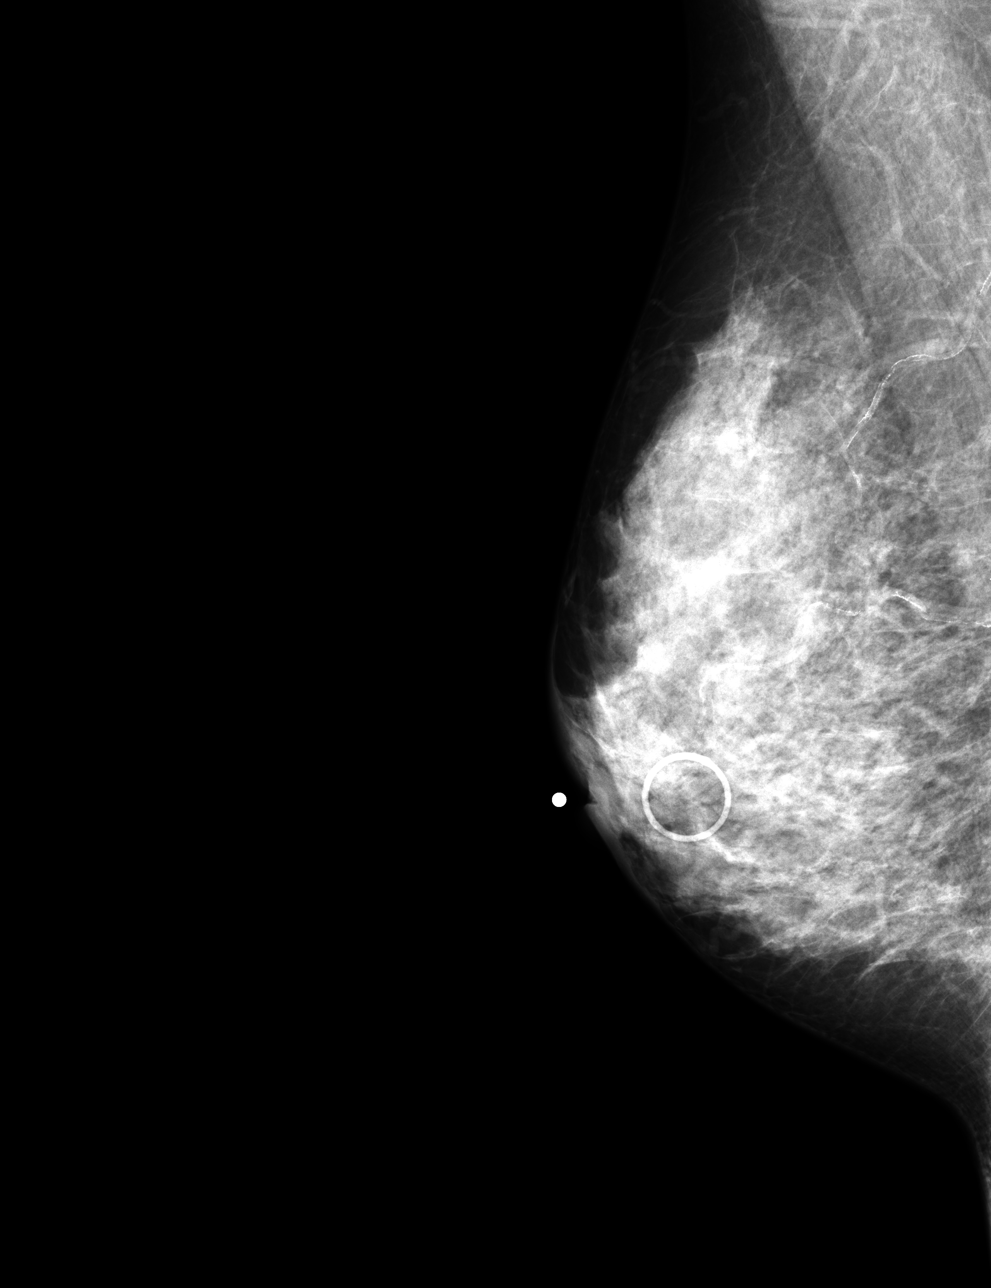
[im 4/4]
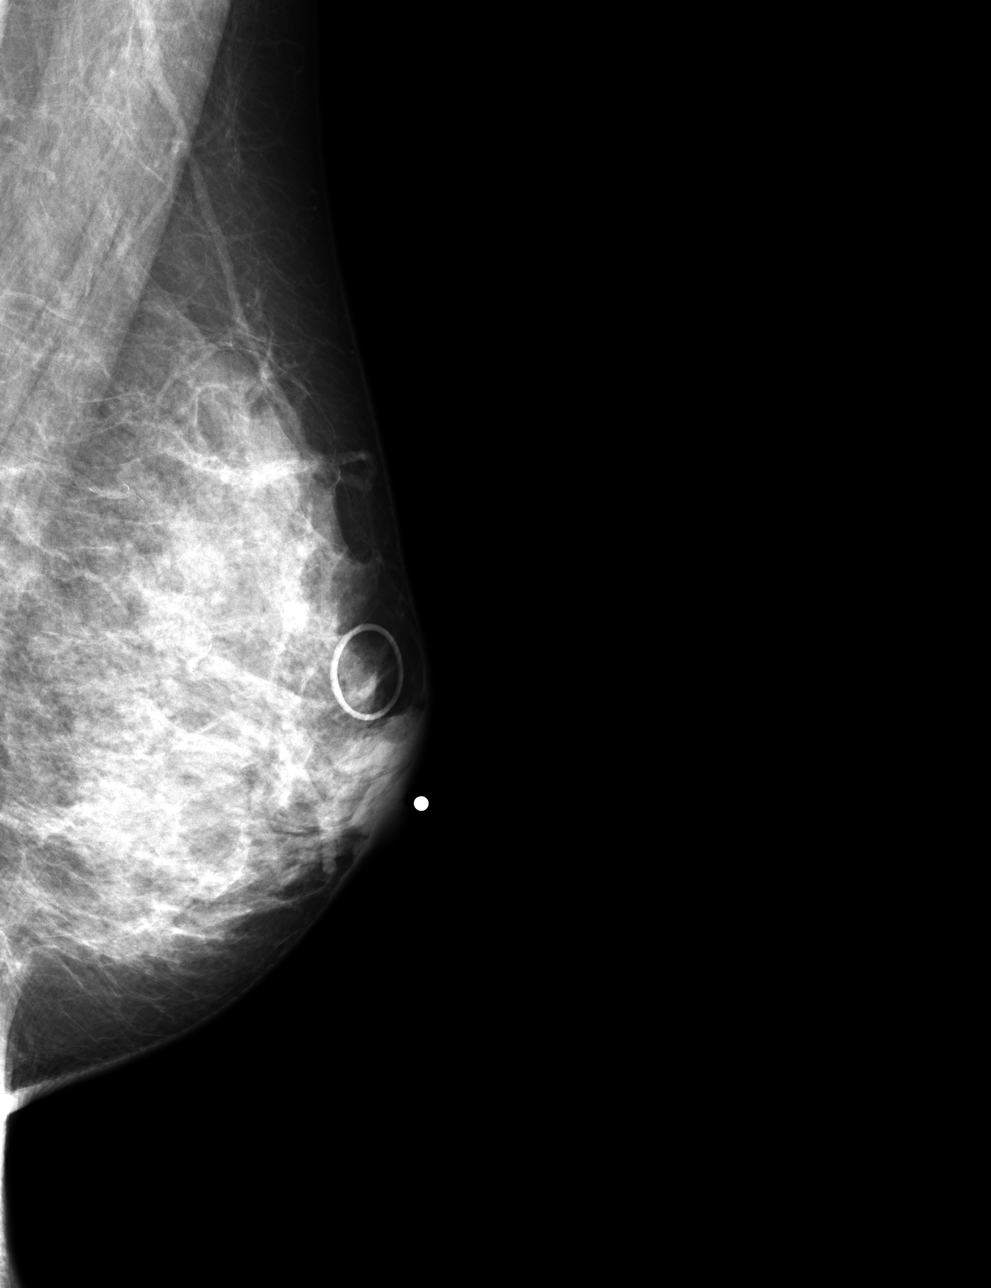

[4 of 4 positions shown; findings below may reference images not displayed]

FINDINGS: The breasts demonstrate heterogeneously dense breast parenchyma. In the right breast, there are two new areas of density which are not present on the previous exam. They are seen at approximately the 11 to 1 o’clock position. They are approximately 3 to 5 cm from the nipple. No abnormal calcifications are seen. There is no skin thickening present.
IMPRESSION: BI-RADS 0

FINAL ASSESSMENT CODE:
BI-RADS 0

________________________________

## 2010-05-30 IMAGING — MG MAMMO UNI RT DIAGNOSTIC W CAD
1 series · 2 of 2 positions shown · non-contrast
Comparison: Screening mammograms of 05/23/10 and 04/06/09.

Enge, Hanne Linn
Dig Mammo CB add views, Right Breast Ultrasound

Wicaksono, Ryodai
Exam:
Right Breast Digital Diagnostic Mammogram and High resolution ultrasound of the right breast.
HISTORY: This 49 year old asymptomatic female underwent screening mammogram here recently which showed a small dense area in the superior right breast requiring additional evaluation. There is strong family history of breast cancer in her mother and in her sister with a calculated lifetime breast cancer risk of 36% compared with 11% in control group.
TECHNIQUE: Cone views of the right breast were obtained in CC and MLO projections followed by high resolution ultrasound of the right breast. Color Doppler study was also performed.

[Series 2: R CC · right · 2 of 2 slices shown]
[im 1/2]
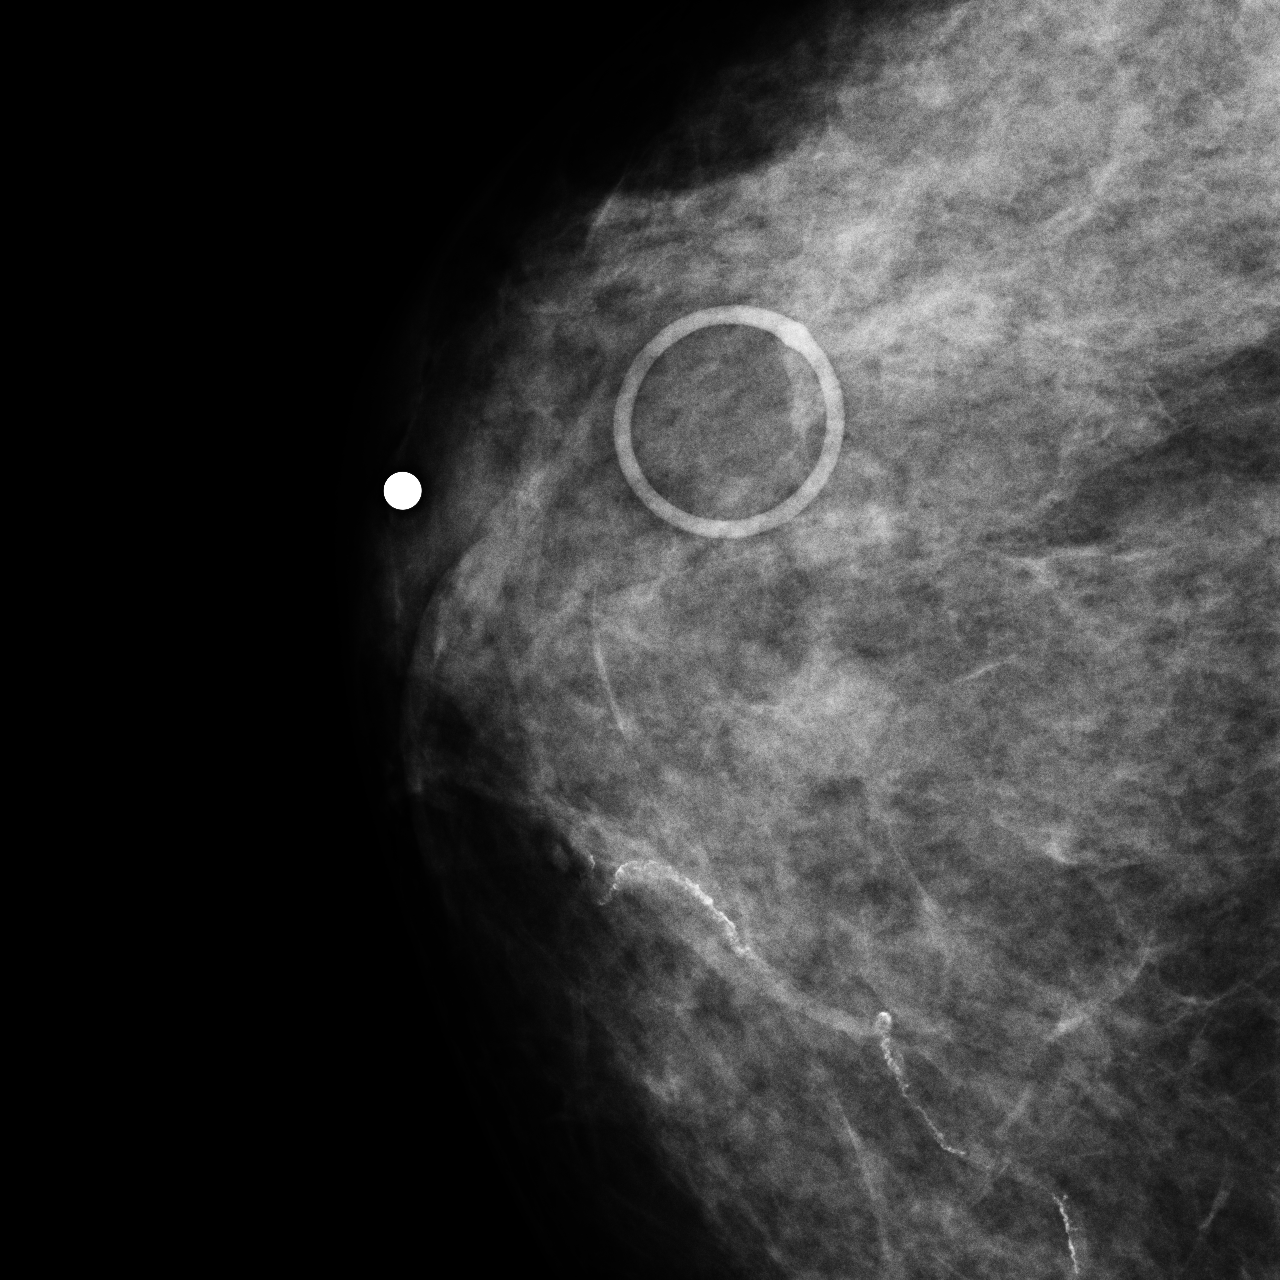
[im 2/2]
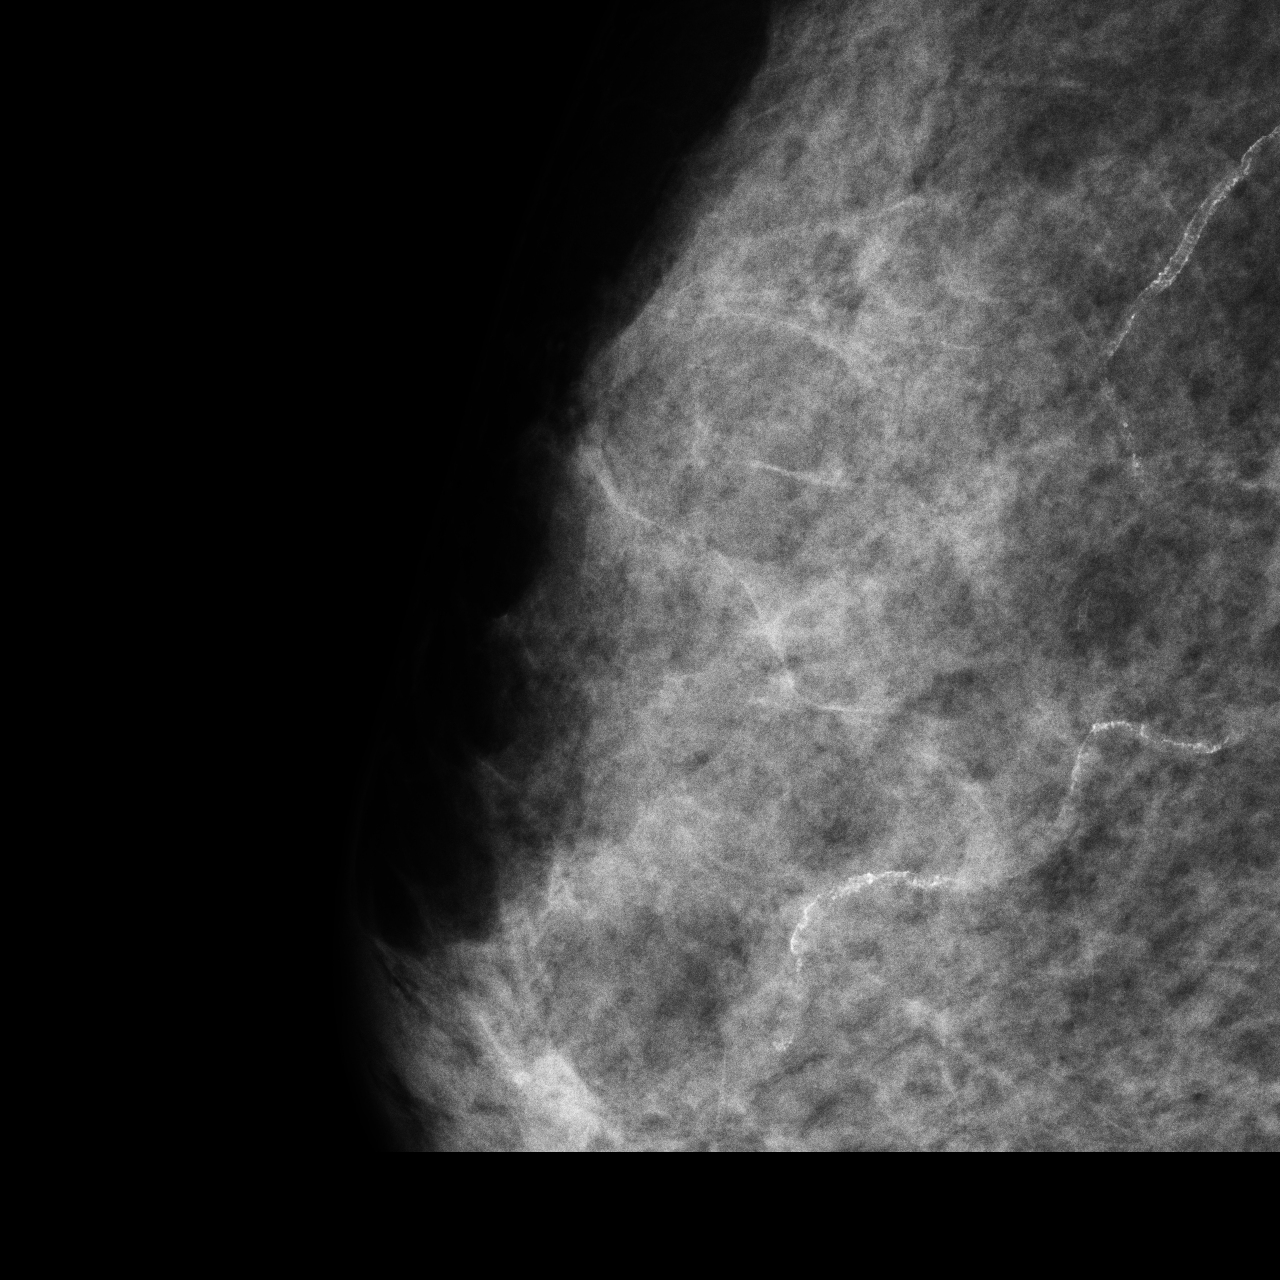

[2 of 2 positions shown; findings below may reference images not displayed]

FINDINGS: Right breast is dense limiting sensitivity to the mammogram in this patient. However, an ill defined nodular density is noted in the superior aspect of the right breast about 4 cm from the nipple seen best in the CC projection. No calcifications or architectural change is seen. 

High resolution ultrasound of the right breast confirms the presence of slightly irregular hypo echoic solid nodule at 1 o’clock position zone 1 about 4 cm from the nipple measuring 9 x 6 x 8 mm in size. Color flow is noted at the edges of this lesion. No posterior shadowing or calcification is seen. The lesion is slightly taller than wide in some places. There are other smaller oval shaped nodules in the breast probably in the 3 to 4 mm size range. These are scattered in the right breast. No duct dilation is seen. 

NOTE:

In compliance with Federal regulations, the results of this mammogram are being sent to the patient.
IMPRESSION: A poorly defined nodular density is noted at 1 o’clock position of the right breast, zone 1 about 4 cm from the nipple both on the mammogram and on the ultrasound. The lesion measures 9 x 6 x 8 mm on ultrasound and is hypo echoic and solid with color flow in the edges of the mass. Since this lesion is not clearly seen on the previous mammogram of 04/06/09 and due to high risk for breast cancer from family history of breast cancer, I recommend ultrasound guided core biopsy of this lesion at 1 o’clock position of the right breast 4 cm from the nipple. 
Final Assessment Code:
Bi-Rads 5

BI-RADS 0
Need additional imaging evaluation
BI-RADS 1
Negative mammogram
BI-RADS 2
Benign finding
BI-RADS 3
Probably benign finding: short-interval follow-up suggested
BI-RADS 4
Suspicious abnormality:  biopsy should be considered
BI-RADS 5
Highly suggestive of malignancy; appropriate action should be taken

________________________________
Tenisha Sall., signed this document electronically

## 2011-01-23 IMAGING — US GYN
1 series · 13 of 16 positions shown · non-contrast
Comparison: Previous study of 01/11/09.

Valovasa, Md Atik

Exam: 
Ultrasound Pelvis Transabdominal
HISTORY: Pelvic pain.

[Series 1: gyn · 0.31mm/px · 13 of 69 slices shown]
[im 1/69]
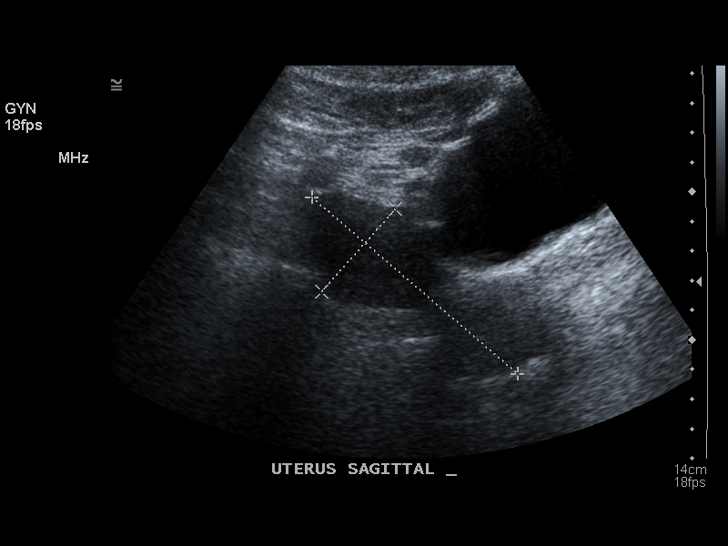
[im 5/69]
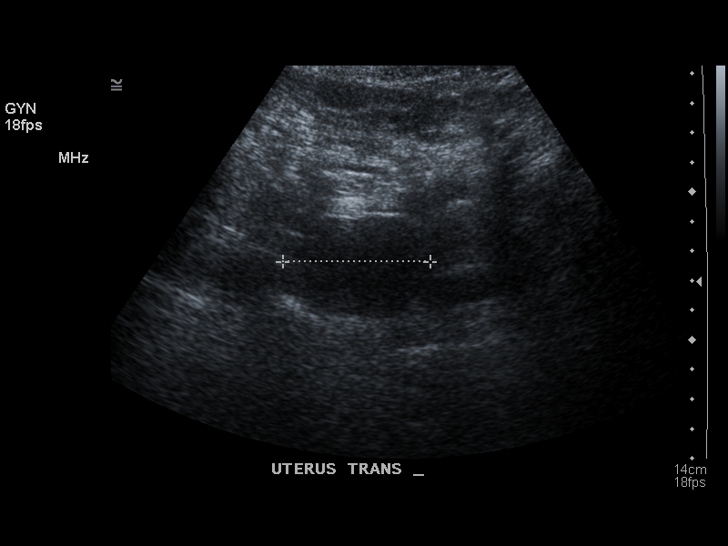
[im 14/69]
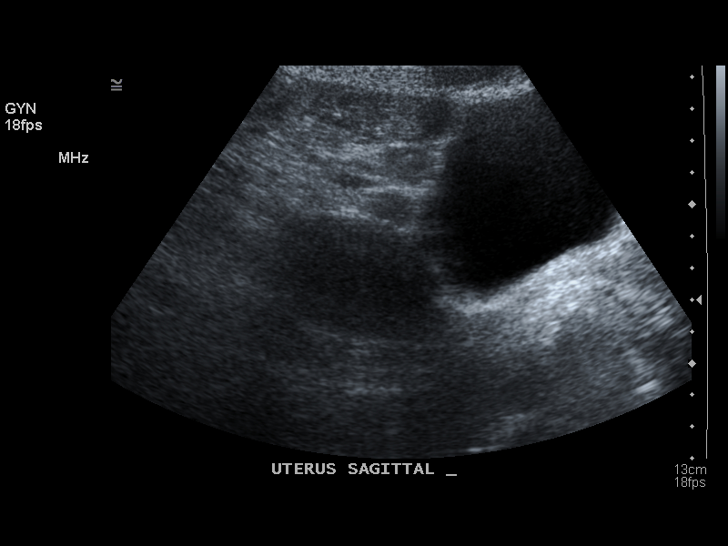
[im 19/69]
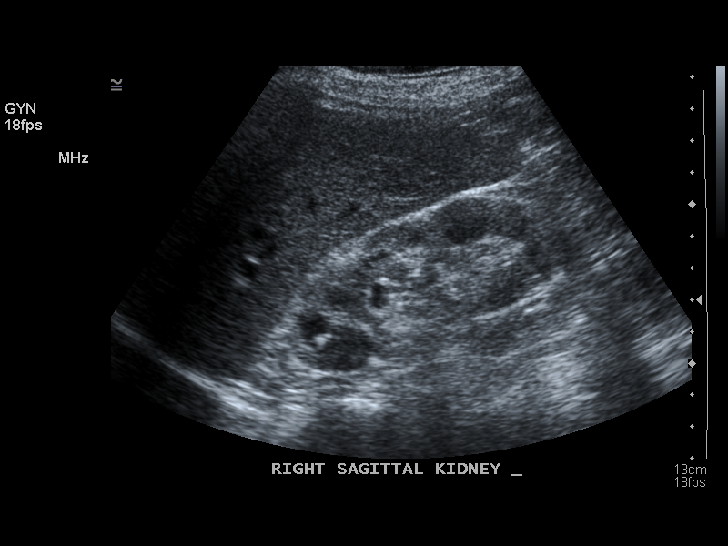
[im 23/69]
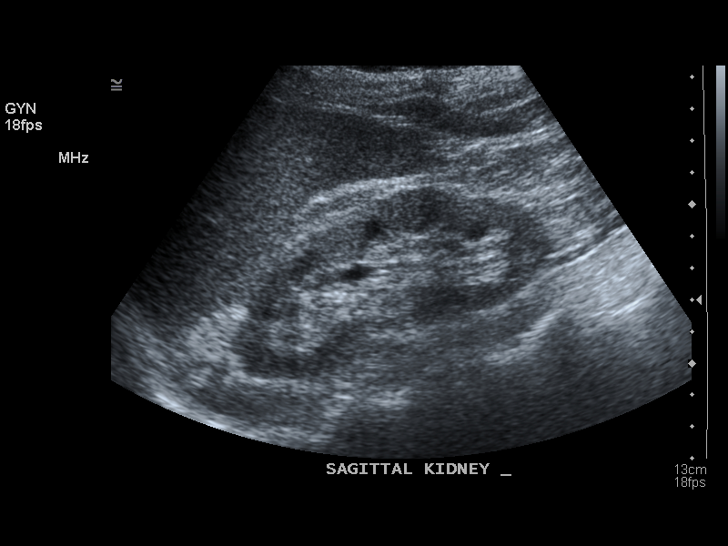
[im 28/69]
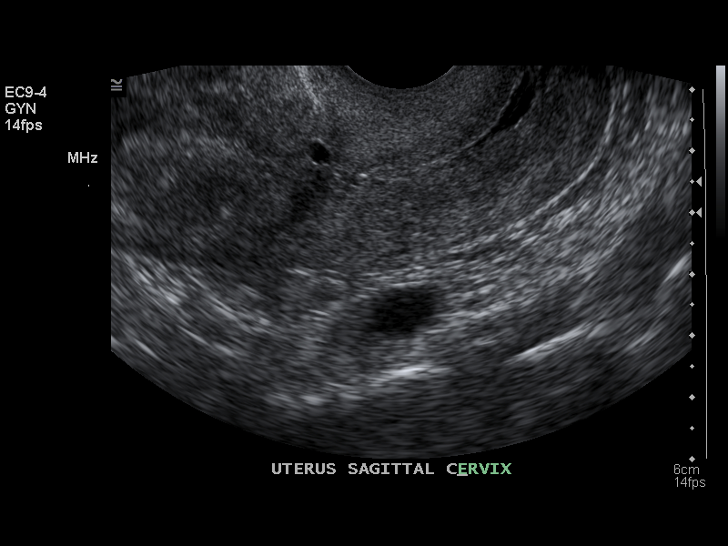
[im 37/69]
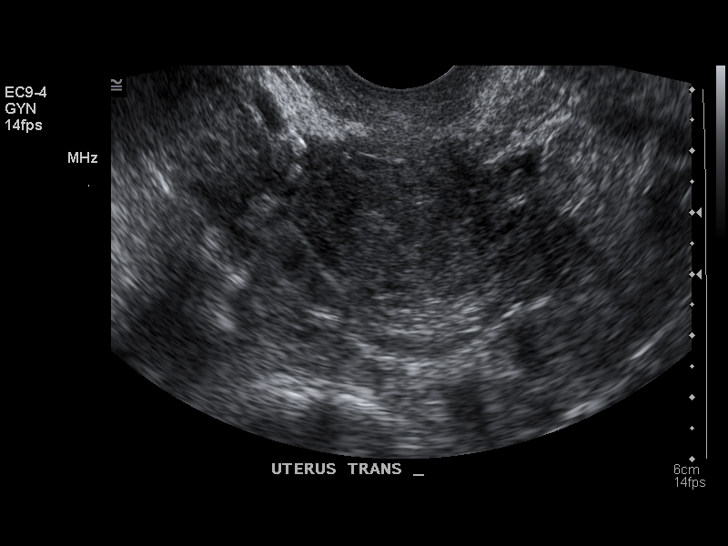
[im 41/69]
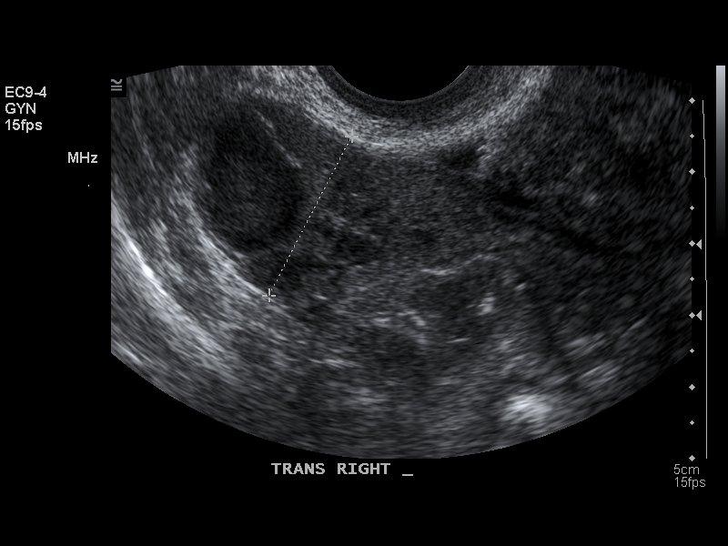
[im 46/69]
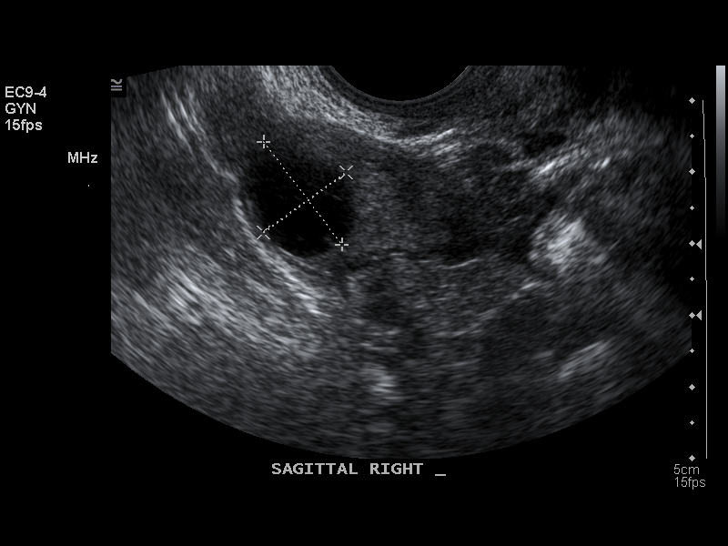
[im 50/69]
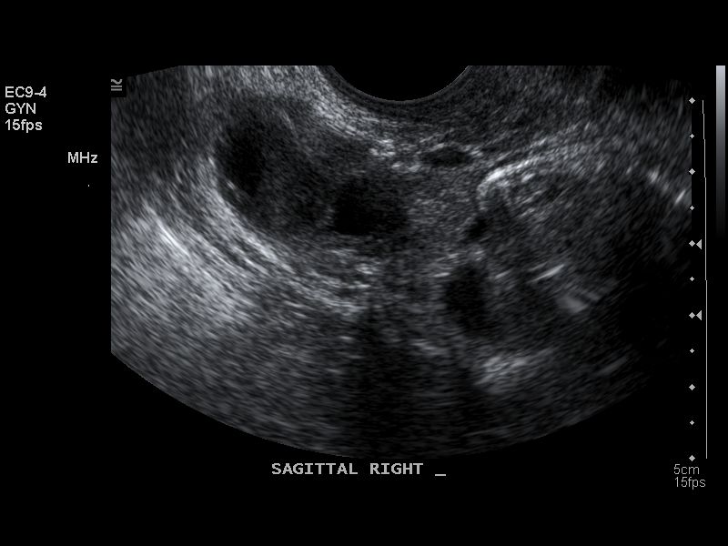
[im 55/69]
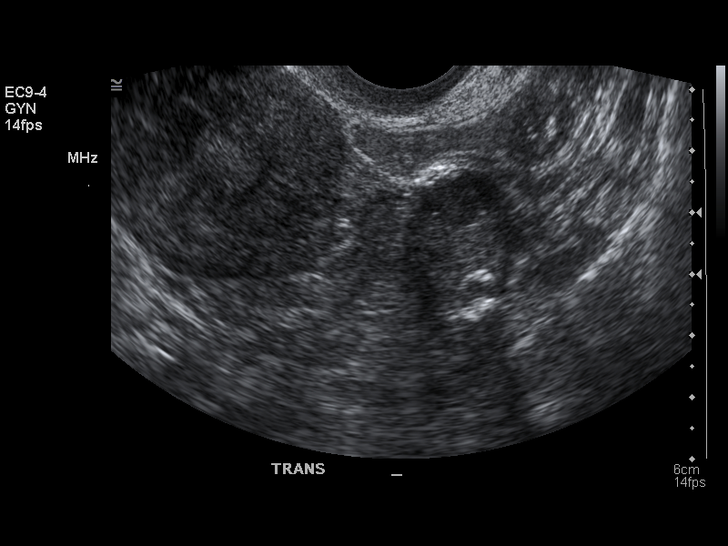
[im 64/69]
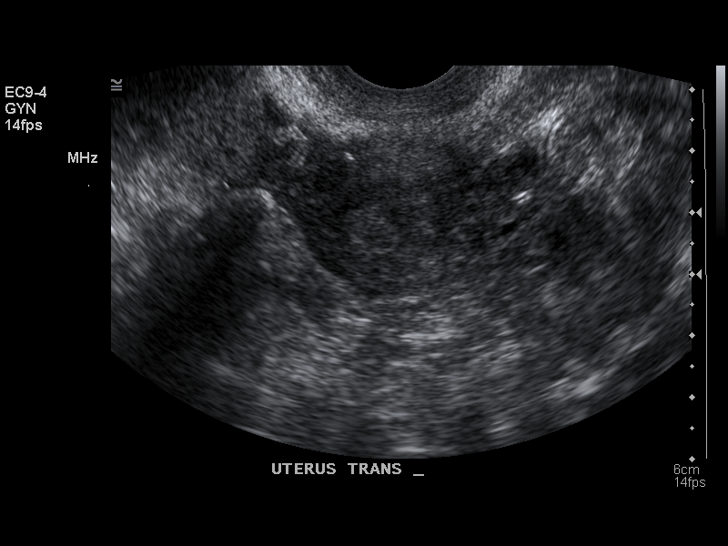
[im 69/69]
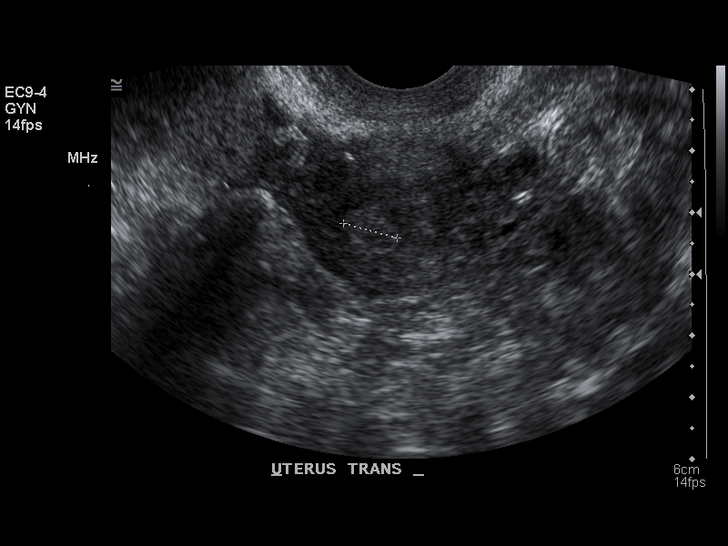

[13 of 16 positions shown; findings below may reference images not displayed]

FINDINGS: Uterus is bulky in size measuring a length of 9 cm and a width of 3.6 cm. Endometrium measures 7 mm in width. C Section scar of the lower uterine segment is noted. No definite fibroid is seen. 
Right ovary is mildly enlarged in size and contains a 2 cm size simple cyst. Small follicles are seen in the right ovary as well as left ovary. No fluid collections are seen in the pelvic cavity. 
Incidentally noted during this examination is evidence of significantly increased echogenic texture of the kidneys on both sides indicating chronic medical renal changes. Please correlate with renal function test results.
IMPRESSION: Slightly bulky uterus with C section scar. No fibroids or other space occupying lesions are seen. Endometrial thickness is 7 mm. 
Mildly enlarged right ovary with a 2 cm size follicular cyst. Follow up evaluation is recommended in six months of the right ovary. 
Incidental finding of increased echo texture of the kidneys on both sides involving the renal cortex indicating probable chronic medical renal changes. Please correlate with kidney function test results. 

________________________________

## 2011-04-03 IMAGING — US GYN
1 series · 14 of 16 positions shown · non-contrast
Comparison: Prior pelvic sonogram dated 01/14/11.

Wenner, Elshaday

Hack, Osmara
Exam: 
Ultrasound Pelvis Complete
INDICATION: Right-sided pain.

[Series 1: gyn · 0.31mm/px · 14 of 58 slices shown]
[im 1/58]
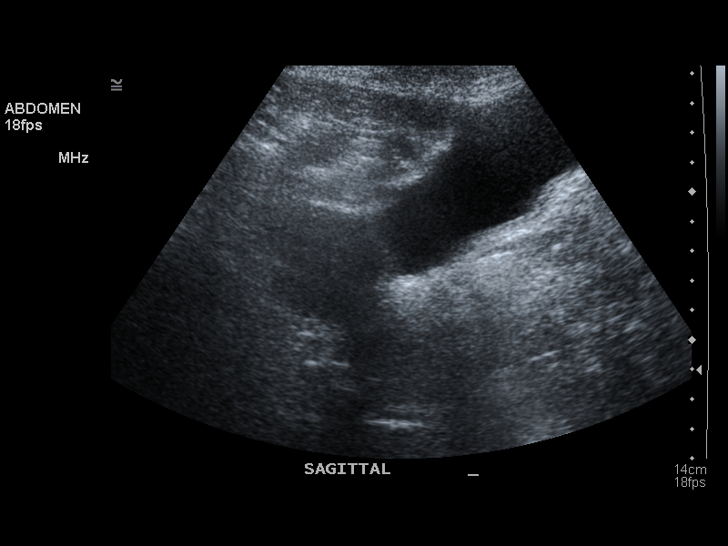
[im 4/58]
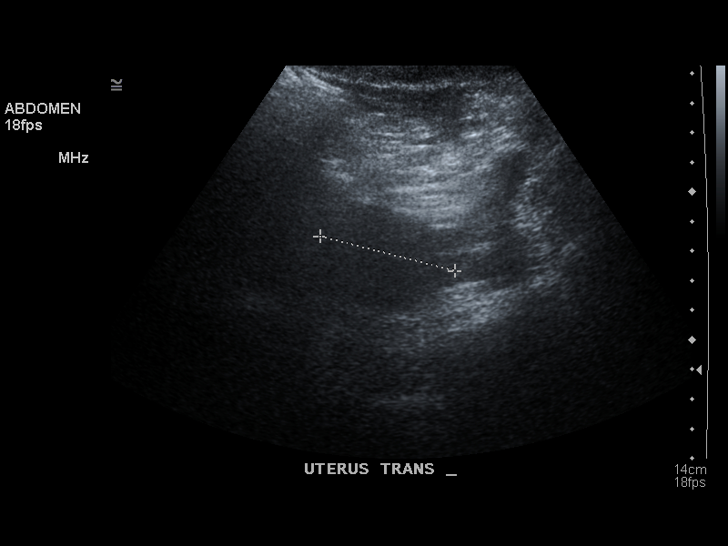
[im 8/58]
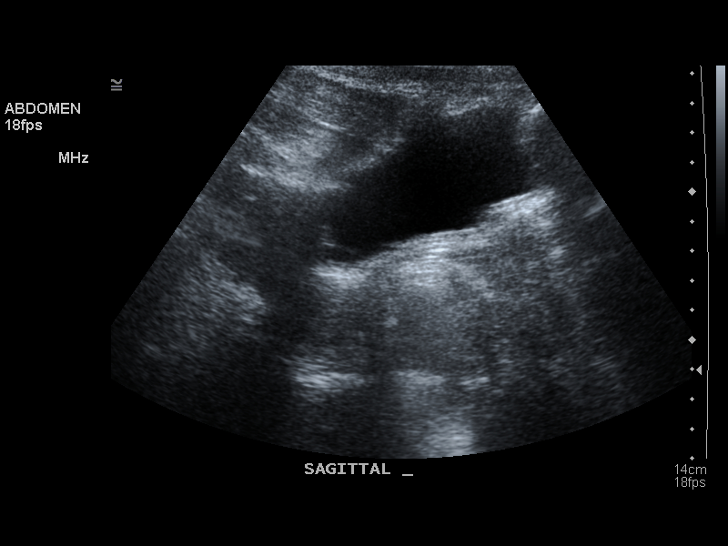
[im 16/58]
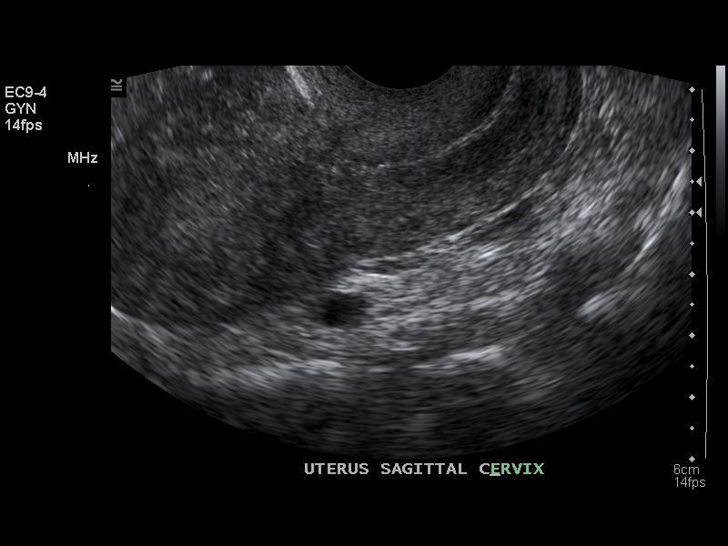
[im 20/58]
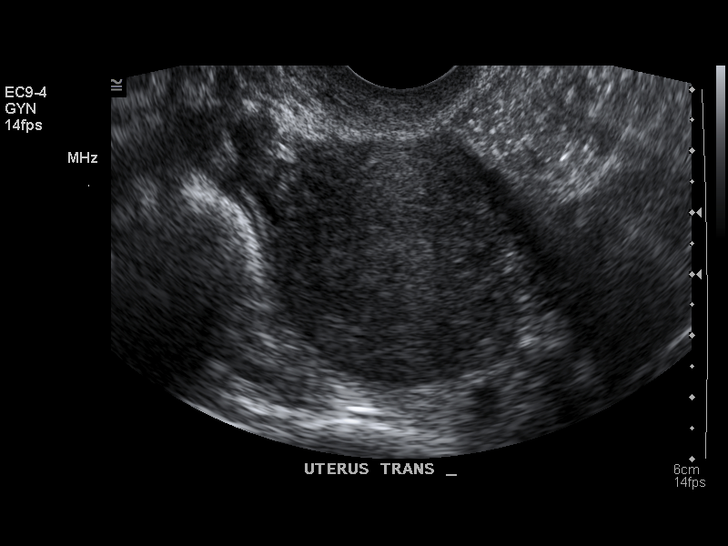
[im 23/58]
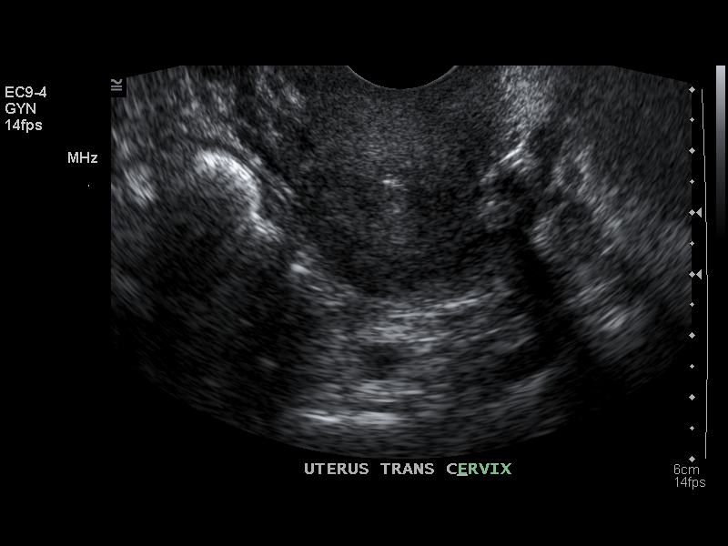
[im 27/58]
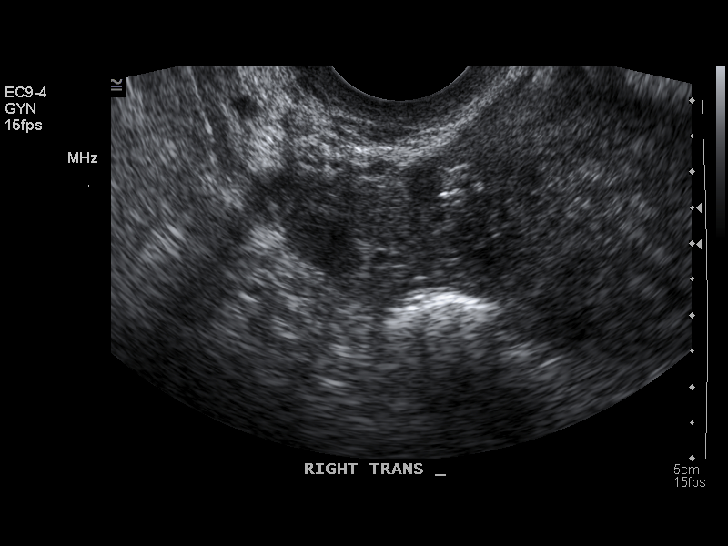
[im 31/58]
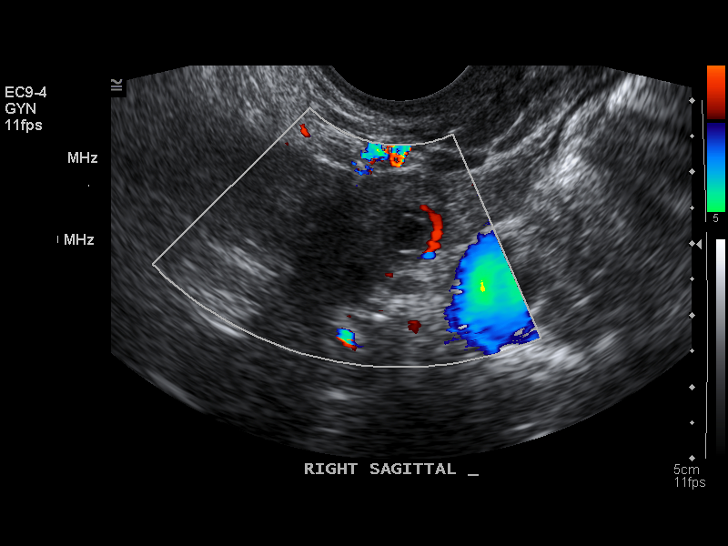
[im 35/58]
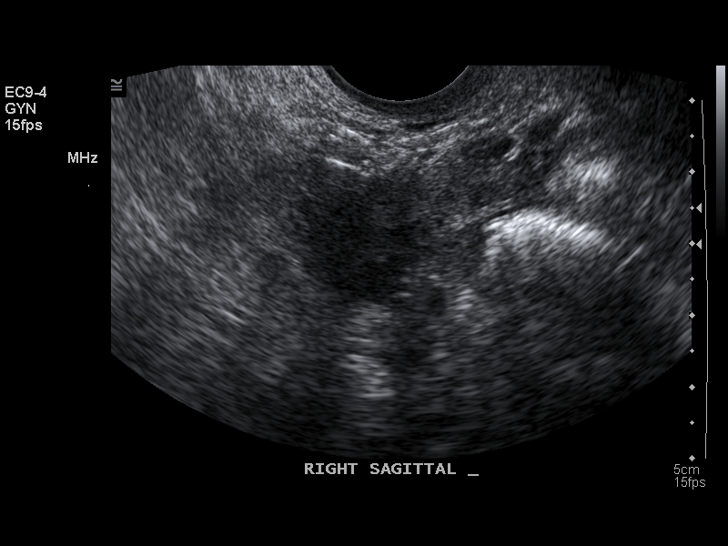
[im 39/58]
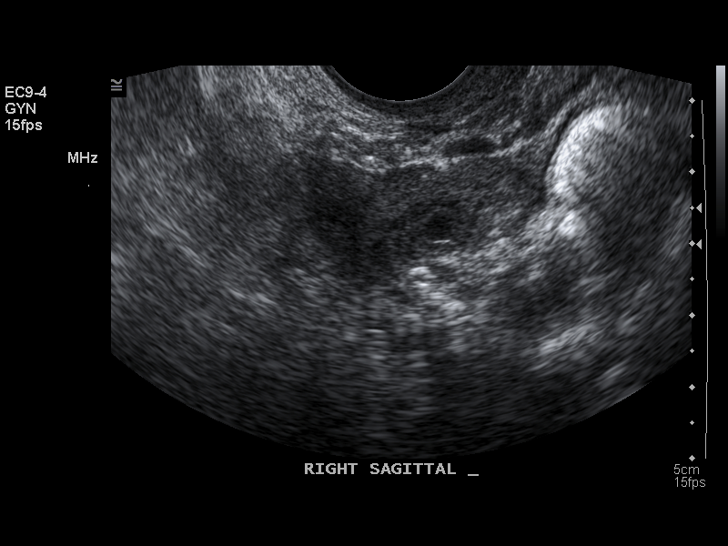
[im 46/58]
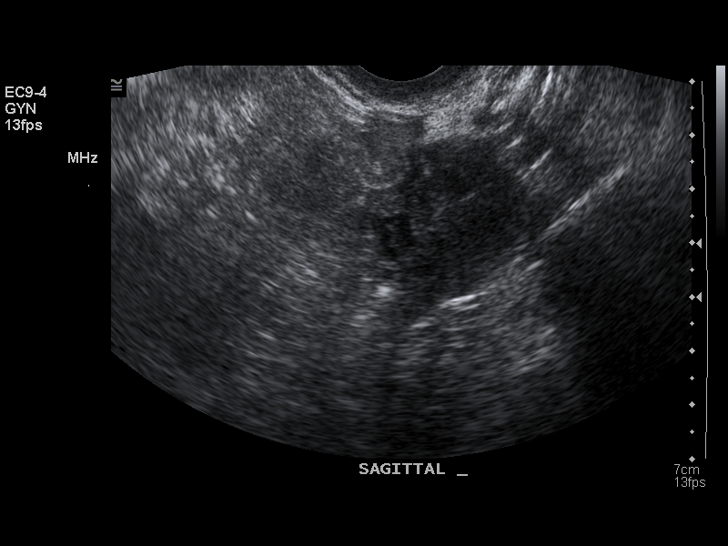
[im 50/58]
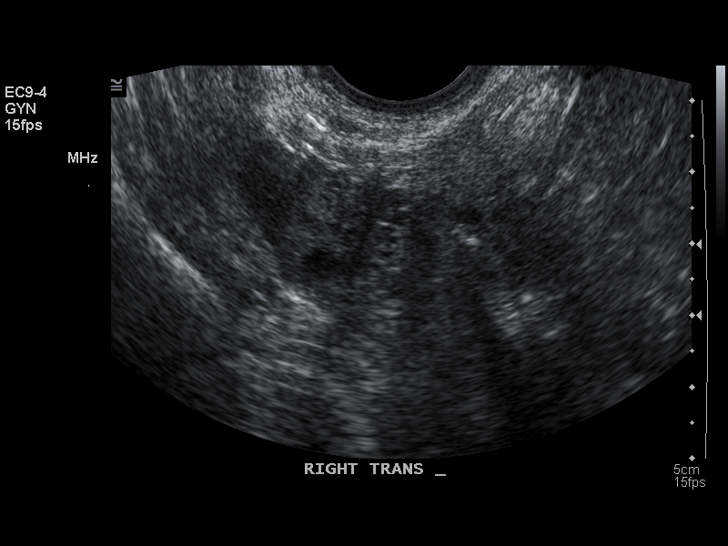
[im 54/58]
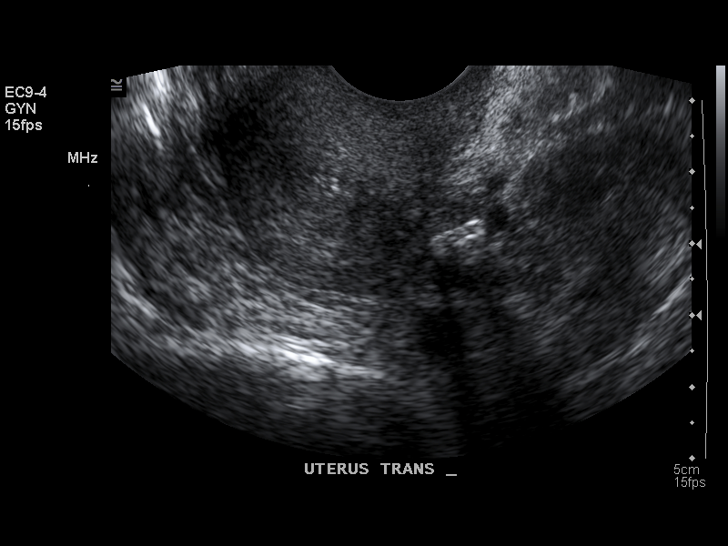
[im 58/58]
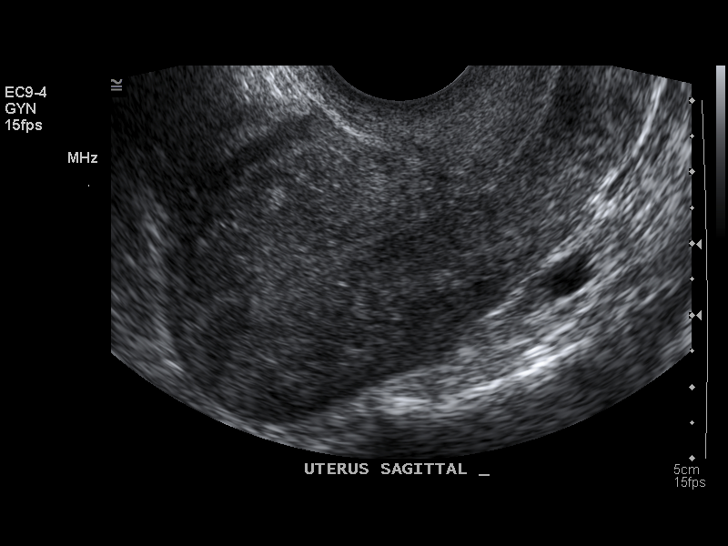

[14 of 16 positions shown; findings below may reference images not displayed]

FINDINGS: Sonographic evaluation of the pelvis was performed transabdominally through a partially distended urinary bladder and transvaginally. 
Uterus is slightly heterogeneous and bulky measuring 8.5 x 3.8 x 4.7 cm. A prior caesarean section scar is also again identified. Endometrial strip measures 7 mm and is unremarkable. 
Ovaries are normal in echogenicity. Right ovary measures 2.2 x 1.4 x 1.7 cm and left ovary measures 2.5 x 1.7 x 1.4 cm. 
There is no pelvic free or adnexal mass.
IMPRESSION: Findings similar to previous examination demonstrating a bulky uterus. 
There is no pelvic free fluid or adnexal mass.  

________________________________

## 2011-04-03 IMAGING — US ABDOMEN
1 series · 14 of 25 positions shown · non-contrast
Comparison: CT abdomen and pelvis dated 06/26/05.

Saa, Jalver

Brunon, Lucyle
Exam: 
Ultrasound abdomen complete
INDICATION: Right-sided abdominal pain.

[Series 1: abdomen · 0.31mm/px · 14 of 32 slices shown]
[im 1/32]
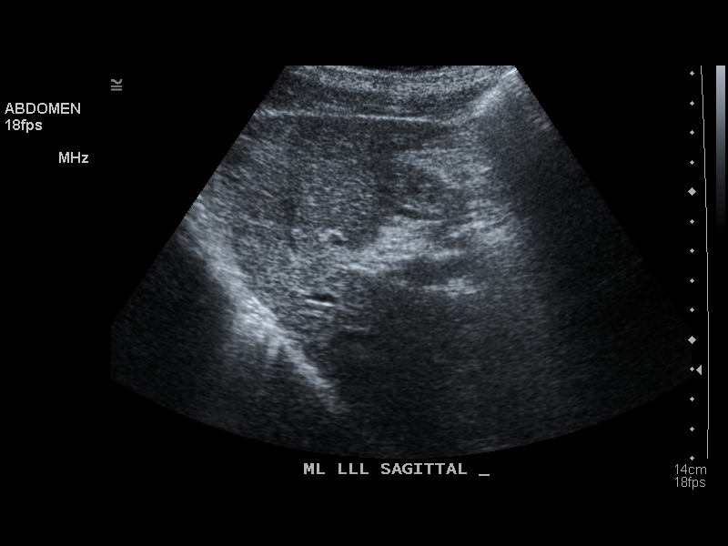
[im 3/32]
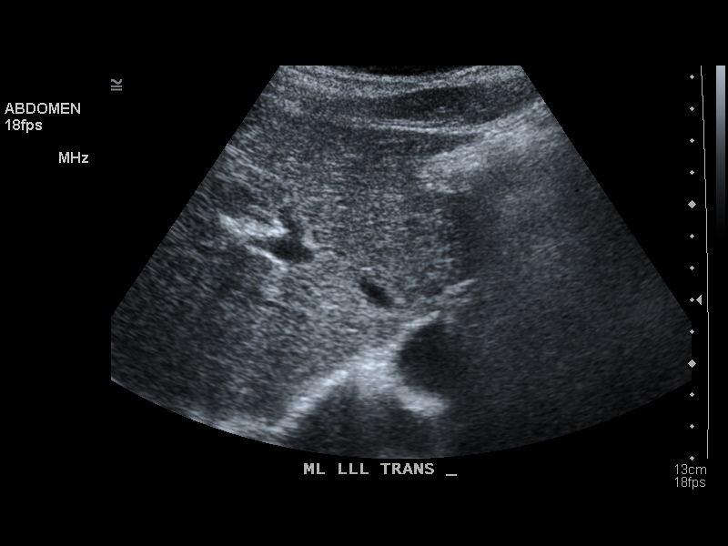
[im 6/32]
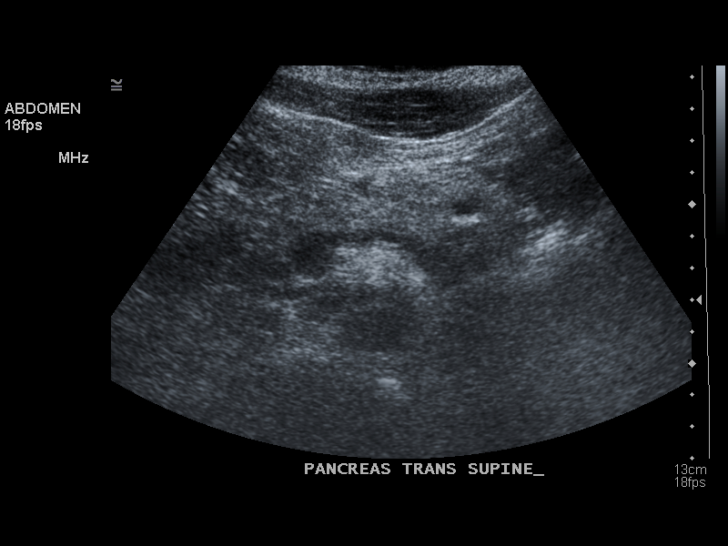
[im 8/32]
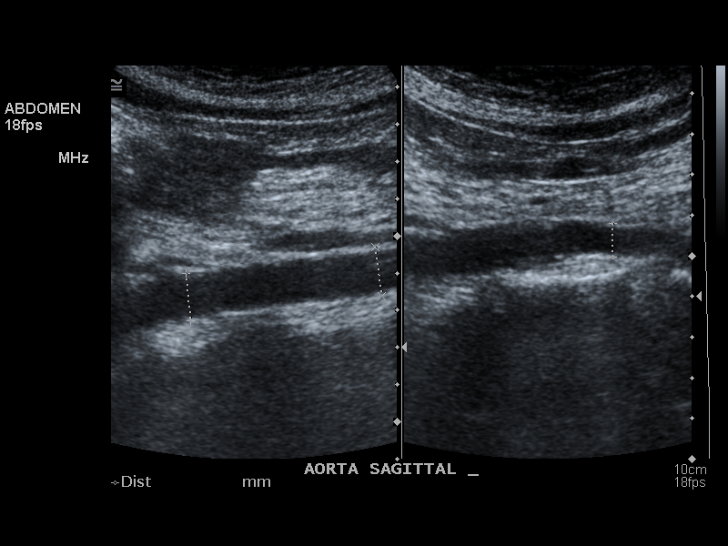
[im 11/32]
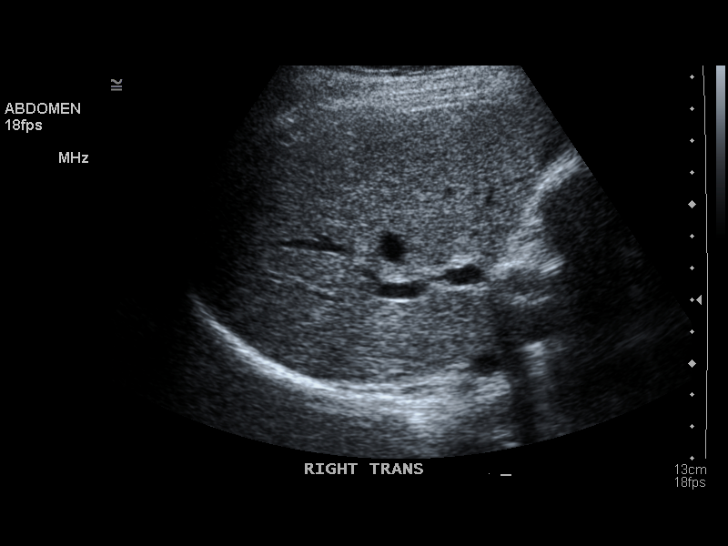
[im 12/32]
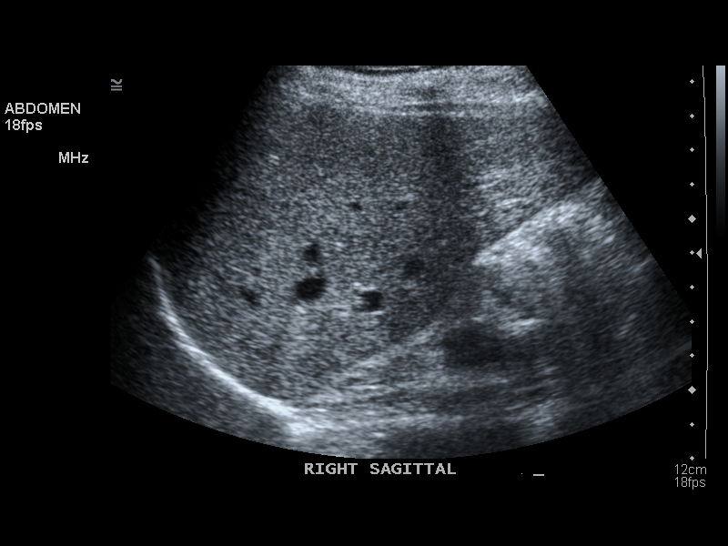
[im 15/32]
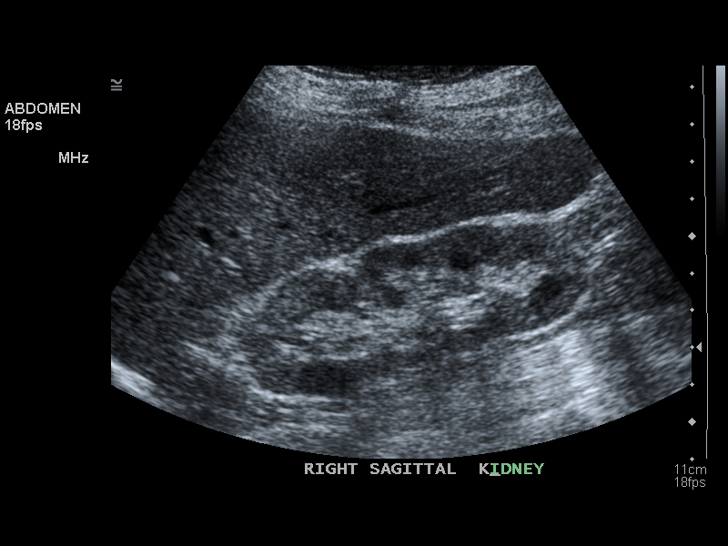
[im 17/32]
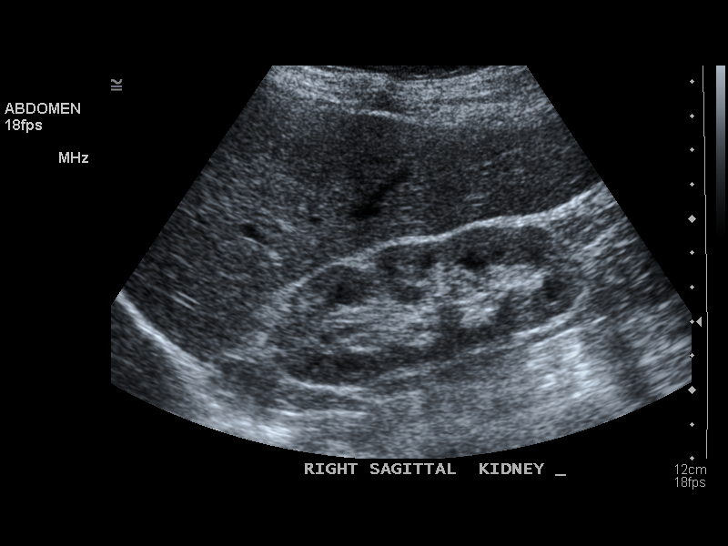
[im 20/32]
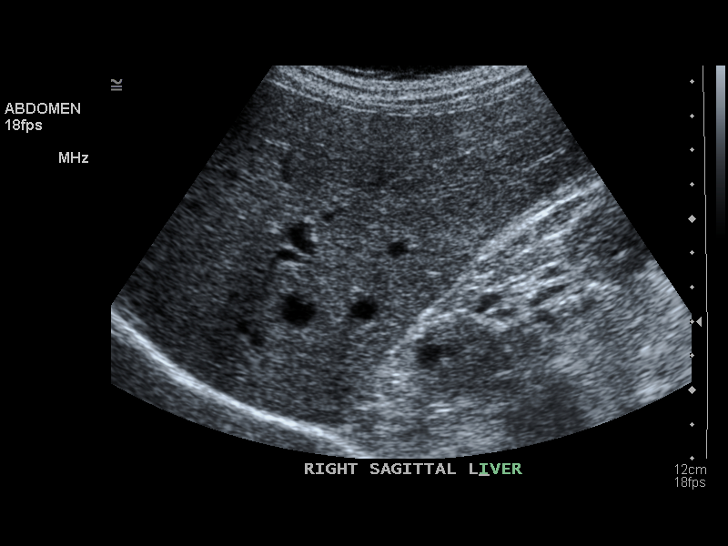
[im 21/32]
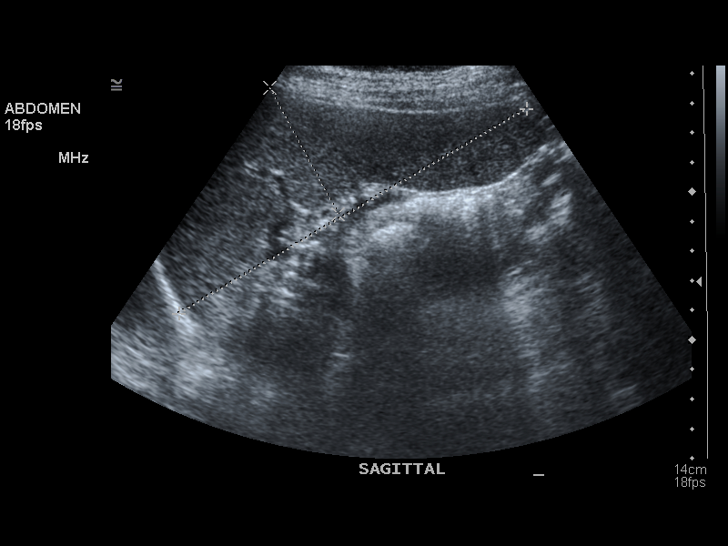
[im 24/32]
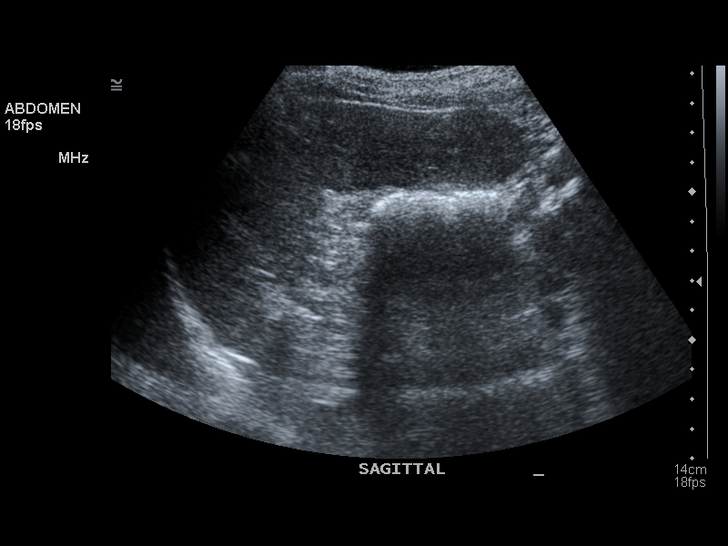
[im 26/32]
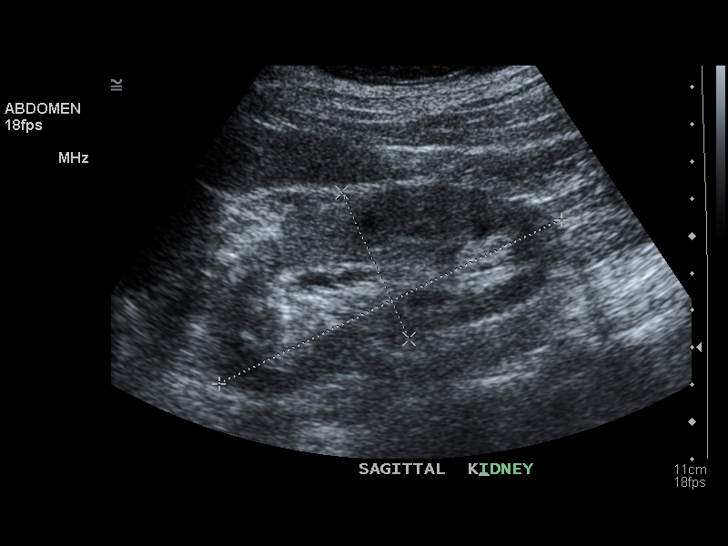
[im 29/32]
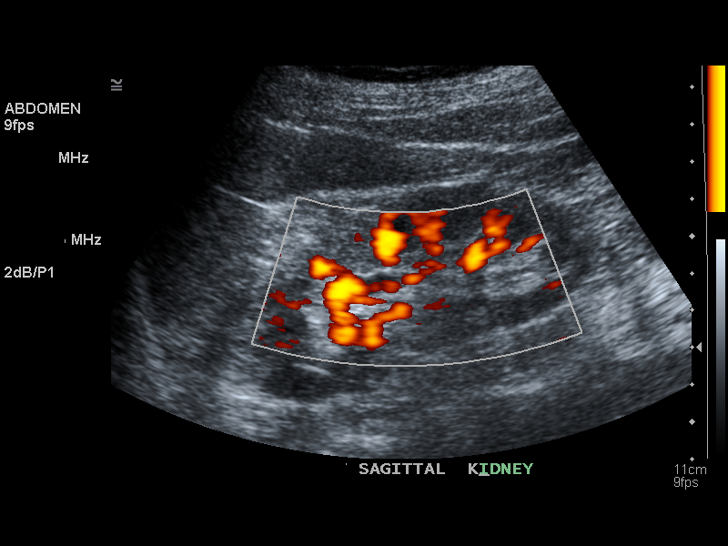
[im 32/32]
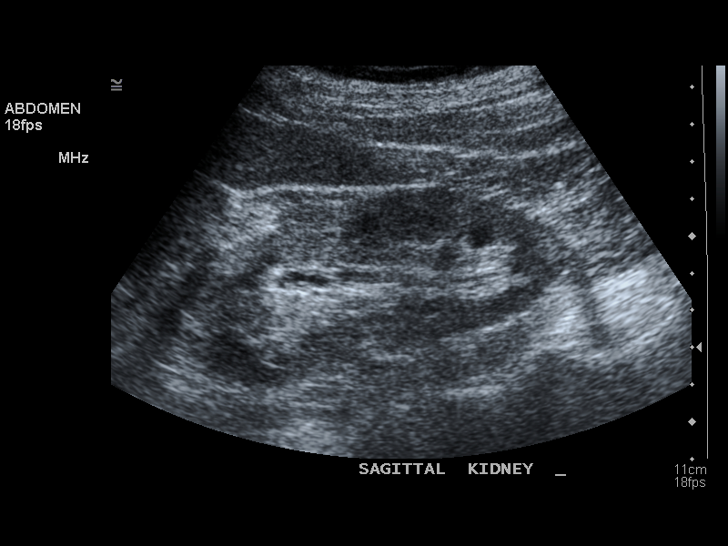

[14 of 25 positions shown; findings below may reference images not displayed]

FINDINGS: The liver is normal in echogenicity. There is no hepatic mass. Previously noted 5 mm hemangioma is not visualized on this exam. There is no intra or extra hepatic biliary ductal dilatation. Common bile duct measures 2 mm. Gallbladder is surgically absent. Pancreas is normal. Spleen measures 13.6 cm and is unremarkable. 
Kidneys are normal in echogenicity. Right kidney measures 9.8 cm and left kidney measures 10 cm. There is no hydronephrosis, mass or shadowing calculus on either side. 
Sagittal survey of abdominal aorta is without aneurysmal dilatation. IVC is normal. There is no ascites.
IMPRESSION: Prior cholecystectomy. 
No definite abnormality.  

________________________________

## 2012-01-15 IMAGING — MG MAMMO SCREEN W CAD
1 series · 6 of 6 positions shown · non-contrast
Comparison: 

Riopel, Dreas

Exam:
Bilateral digital screening mammogram with CAD
INDICATION: Annual.

[Series 2: R CC · right · 6 of 6 slices shown]
[im 1/6]
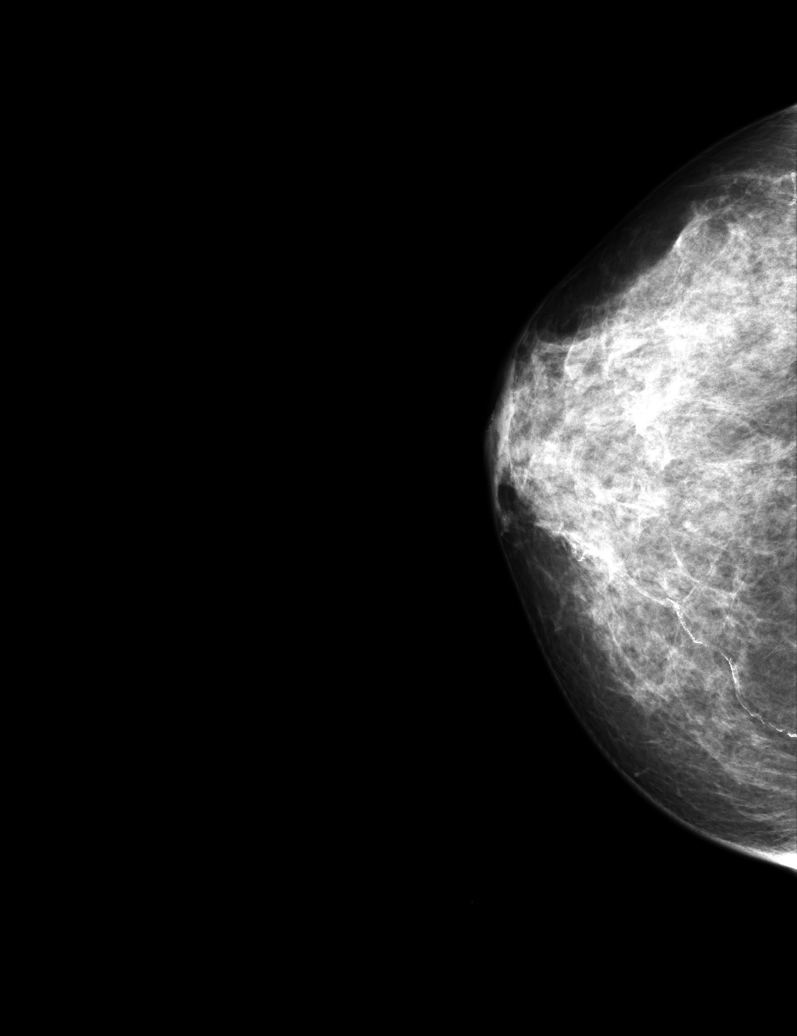
[im 2/6]
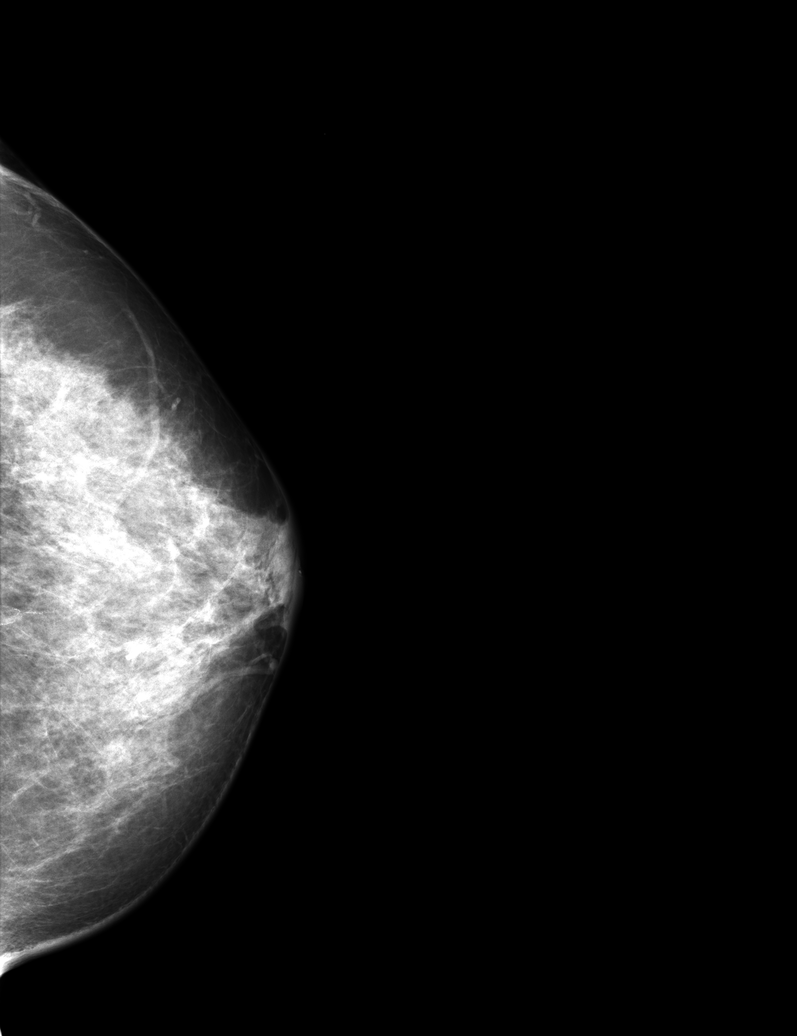
[im 3/6]
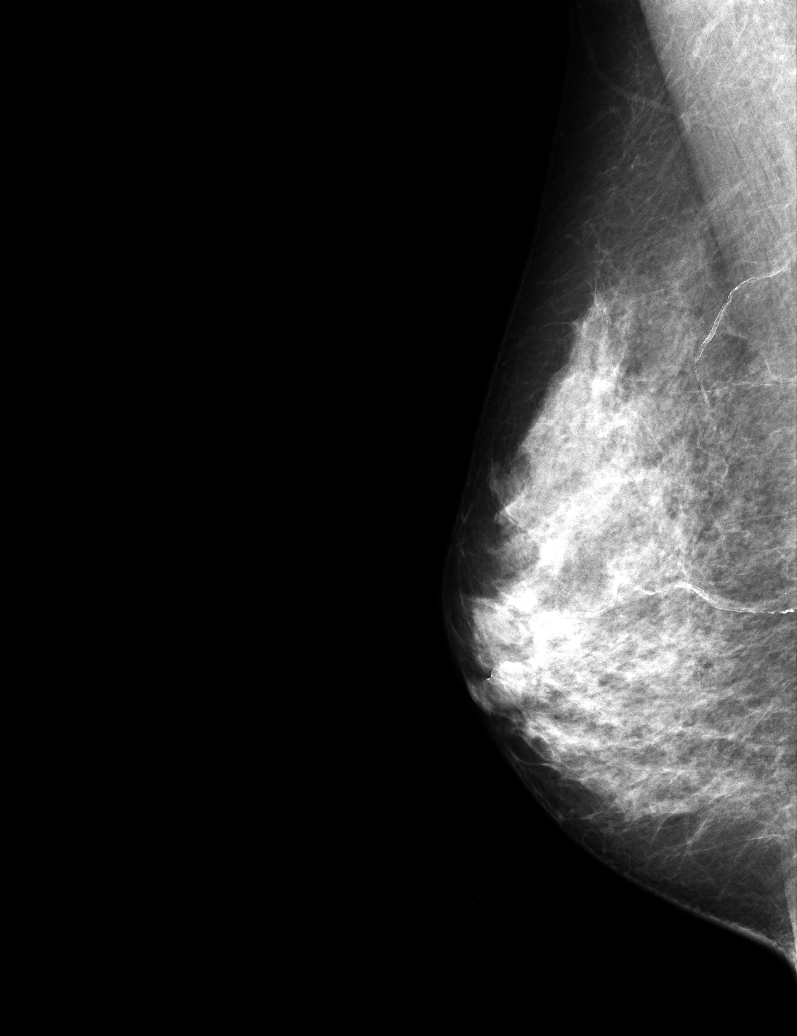
[im 4/6]
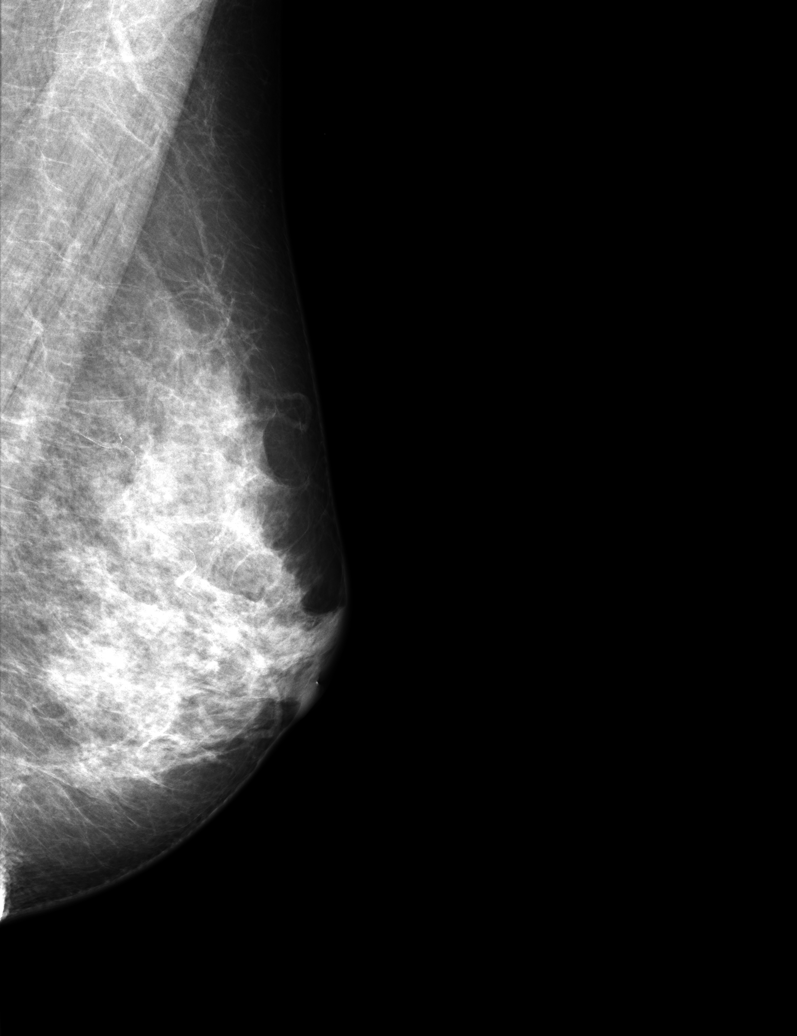
[im 5/6]
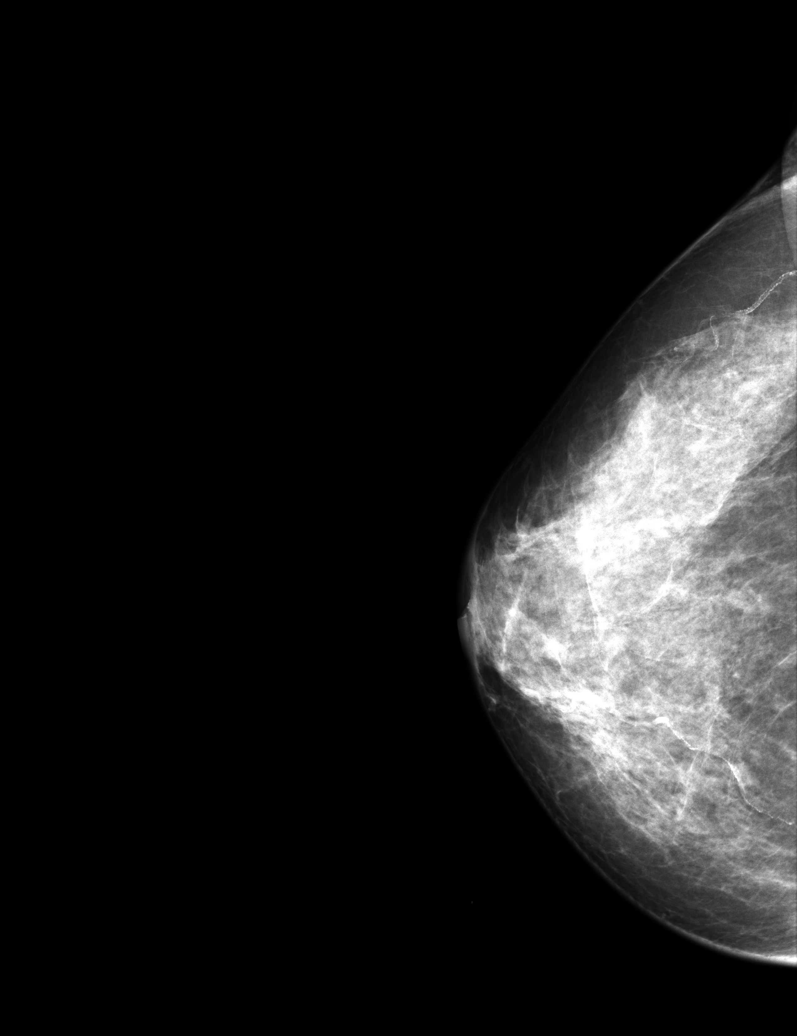
[im 6/6]
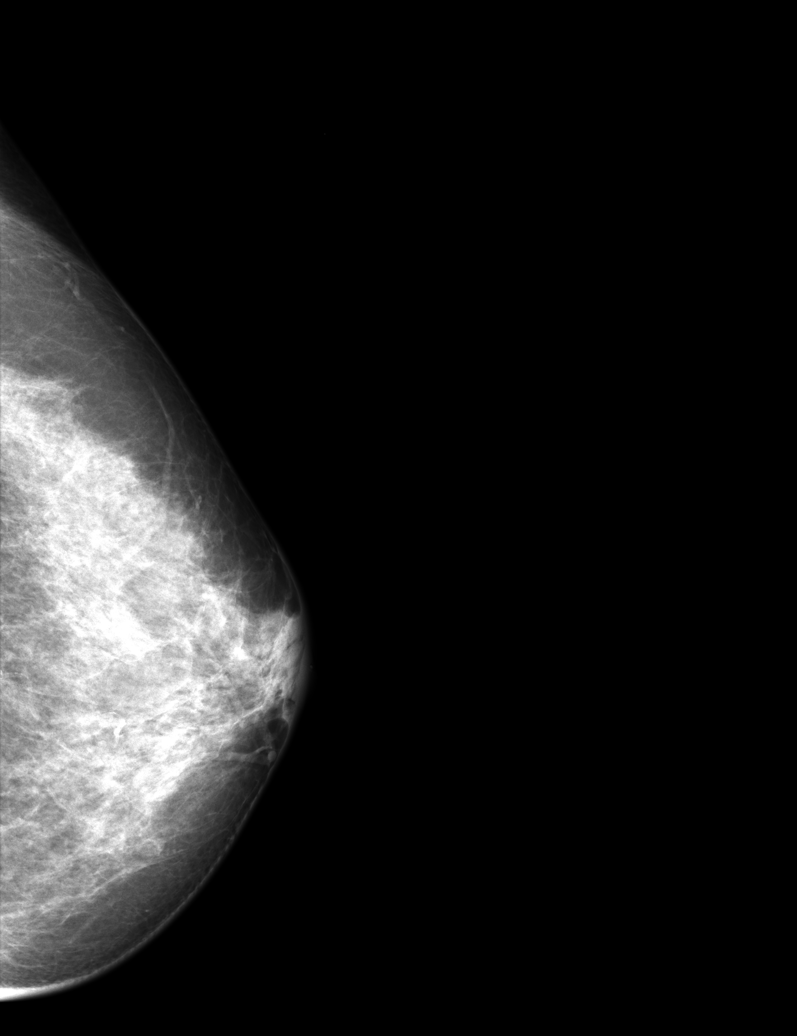

[6 of 6 positions shown; findings below may reference images not displayed]

FINDINGS: Breast parenchyma is extremely dense, this decreases the sensitivity of mammogram. There is no mass or suspicious cluster of microcalcifications. There is no architectural distortion, skin thickening or nipple retraction.
IMPRESSION: Bi-Rads 2-Benign findings. 
RECOMMENDATIONS: Annual screening mammogram as per ACR guidelines is recommended. 
Final Assessment Code:
Bi-Rads 2 

BI-RADS 0
Need additional imaging evaluation
BI-RADS 1
Negative mammogram
BI-RADS 2
Benign finding
BI-RADS 3
Probably benign finding: short-interval follow-up suggested
BI-RADS 4
Suspicious abnormality:  biopsy should be considered
BI-RADS 5
Highly suggestive of malignancy; appropriate action should be taken

NOTE:
In compliance with Federal regulations, the results of this mammogram are being sent to the patient.

________________________________

## 2012-04-29 IMAGING — CR LSSP
1 series · 4 of 4 positions shown · non-contrast
Comparison: None.

Balzar, Mupangwa
XRay LS spine 4 views X

Exam:
Lumbar Spine 4V
INDICATION: Pain.

[Series 1: view not recorded · oblique · 0.17mm/px · 4 of 4 slices shown]
[im 1/4]
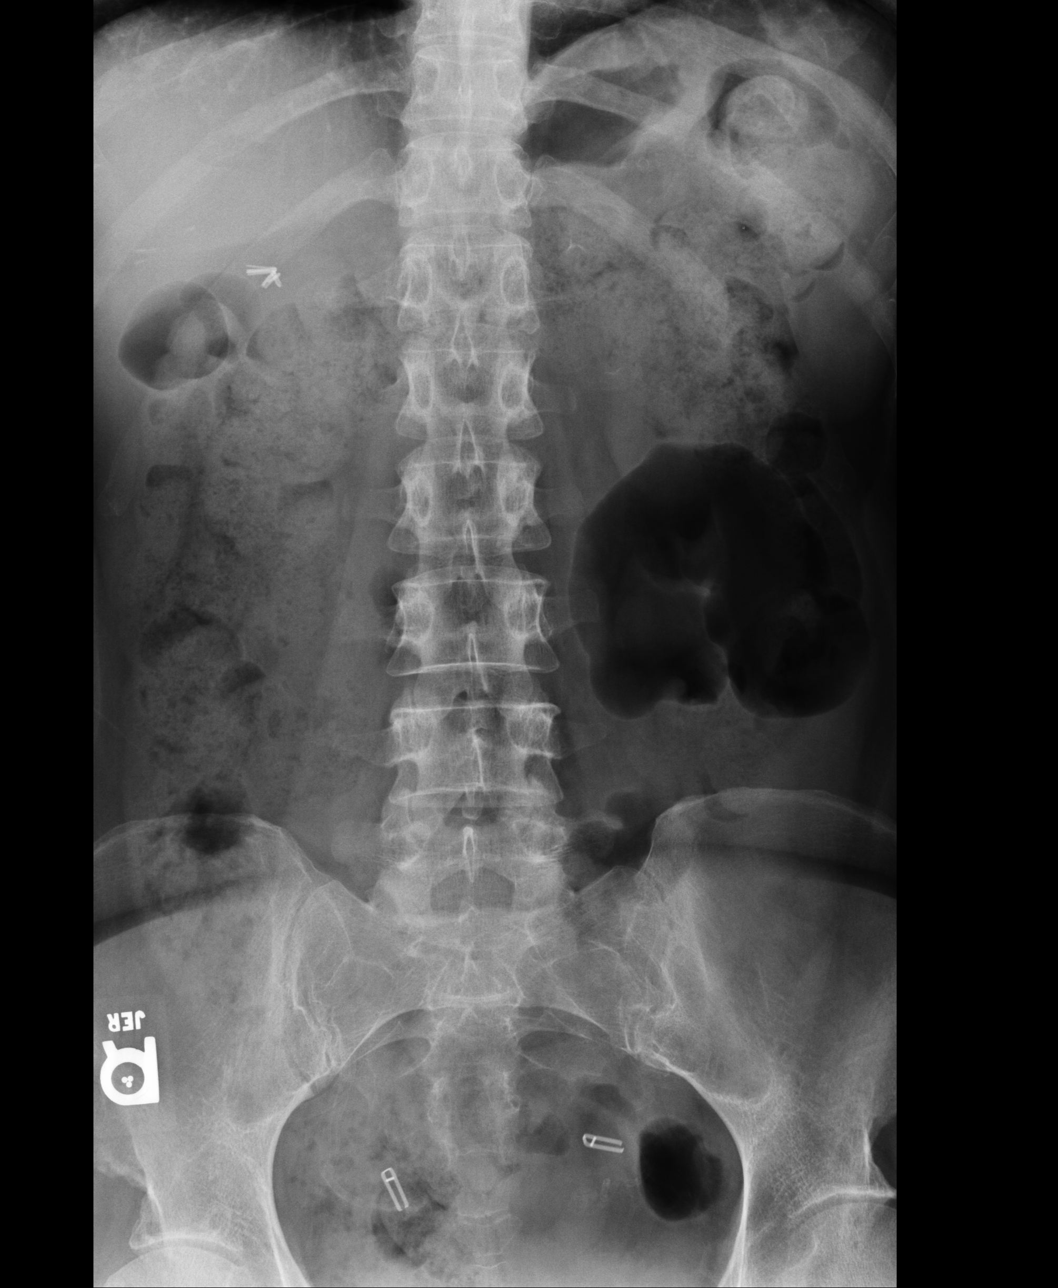
[im 2/4]
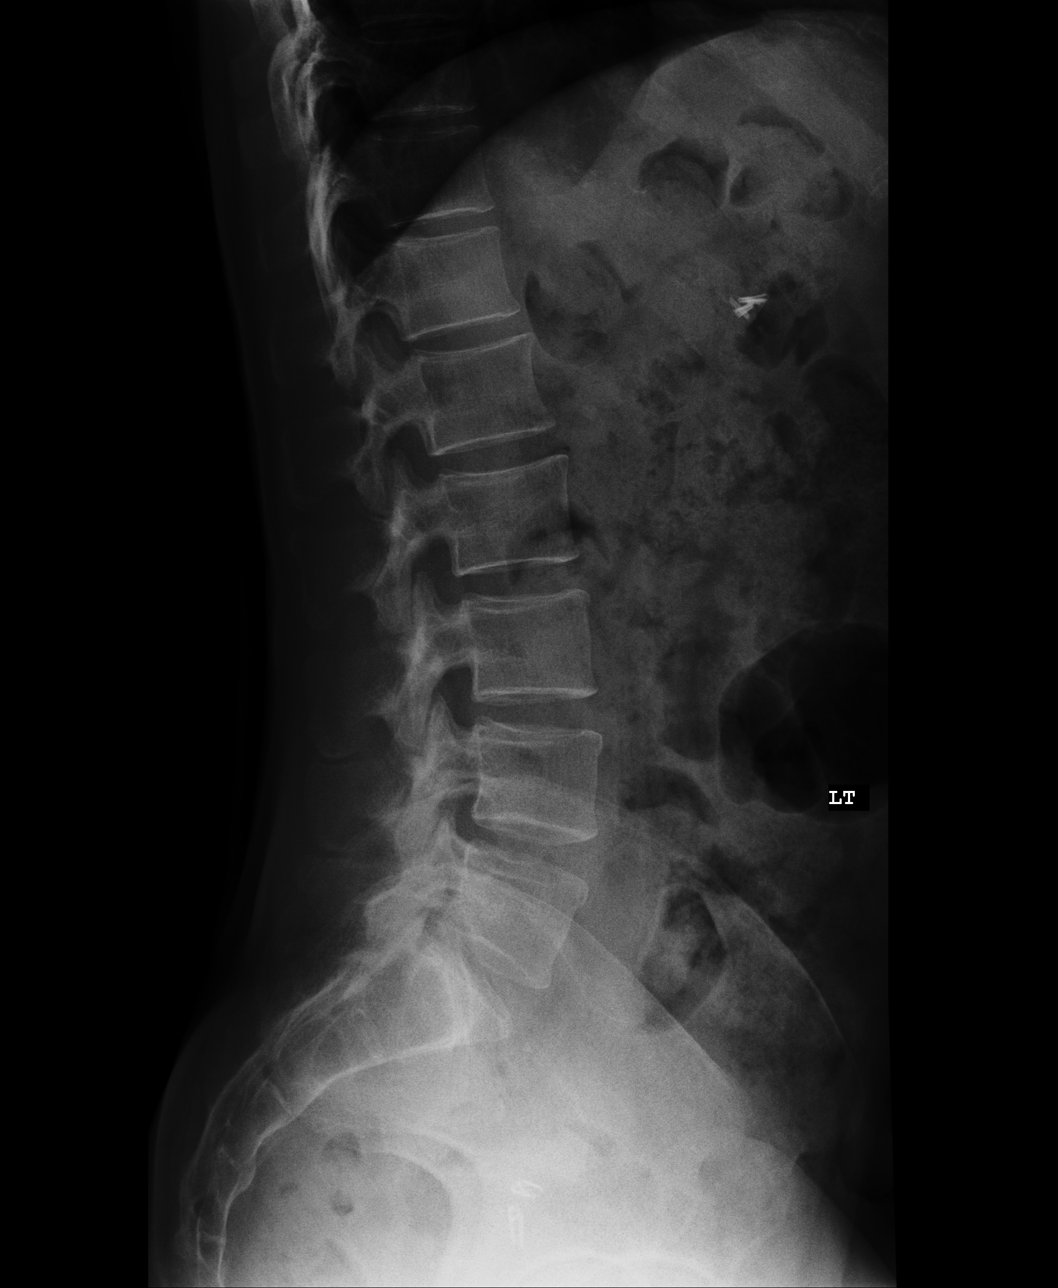
[im 3/4]
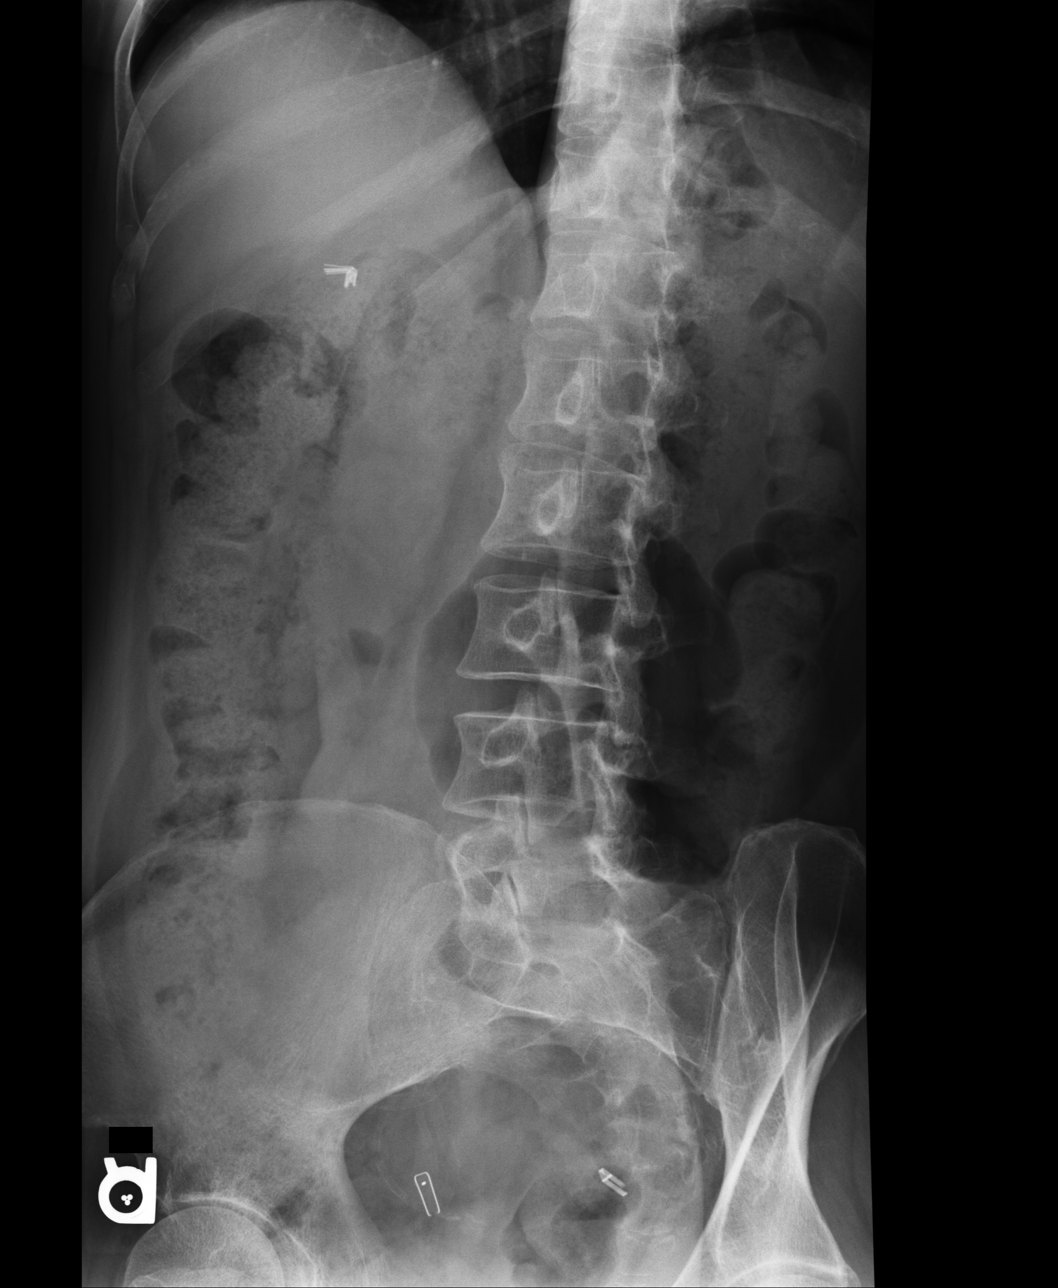
[im 4/4]
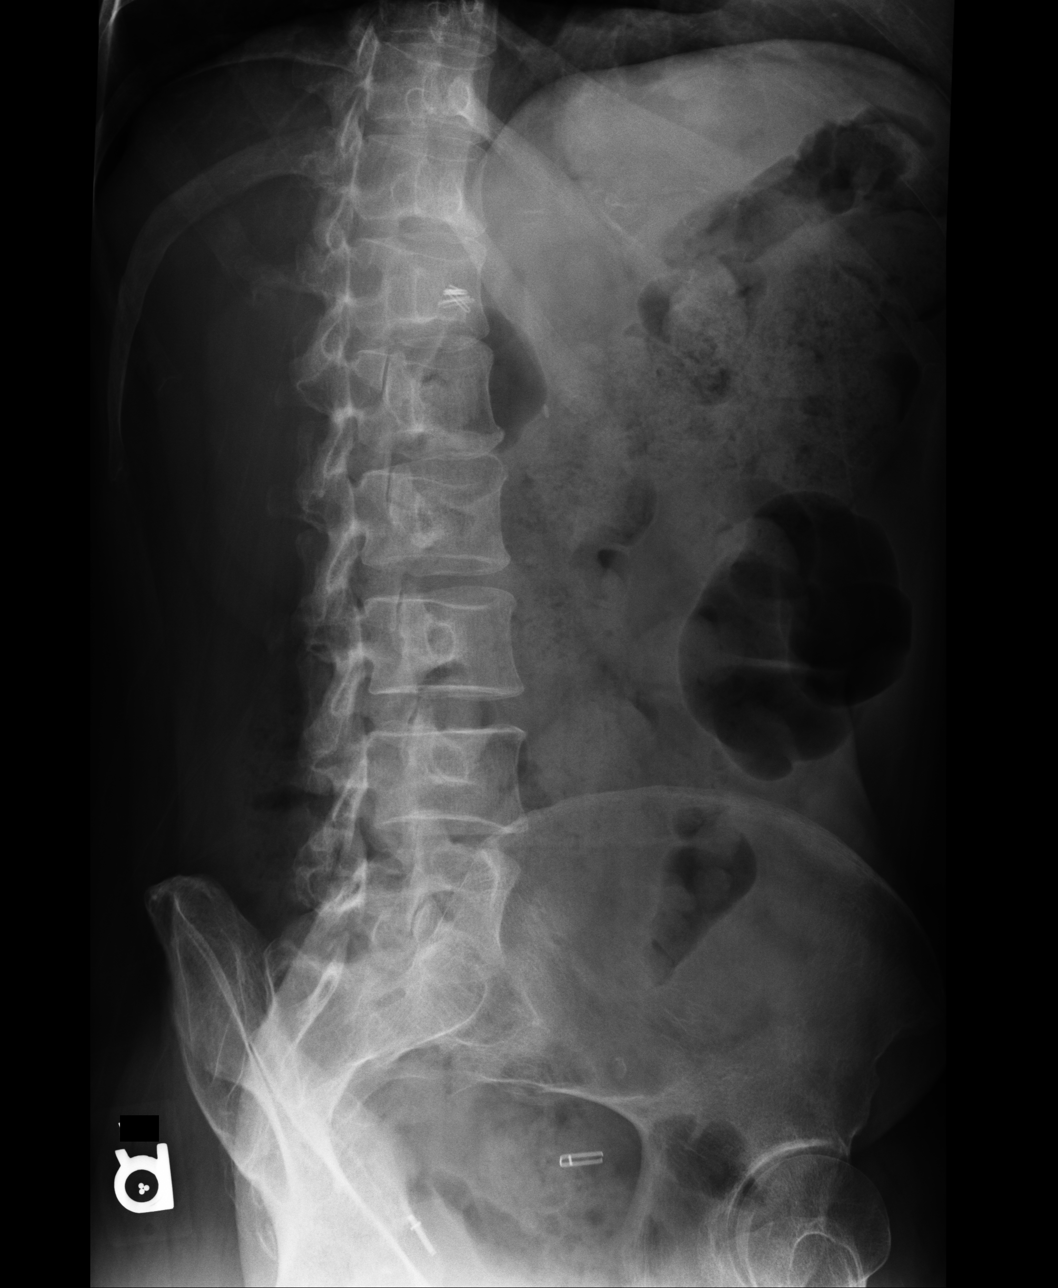

[4 of 4 positions shown; findings below may reference images not displayed]

FINDINGS: Lateral exam demonstrates normal height and alignment of the lumbar vertebral bodies. The disk spaces appear preserved. No pars defects are seen. No fractures are seen.  Tubal ligation and cholecystectomy clips are also identified. There is a moderate amount of stool throughout the colon.
IMPRESSION: Negative lumbar spine. 

________________________________

## 2012-04-29 IMAGING — US ABDOMEN
1 series · 14 of 25 positions shown · non-contrast
Comparison: 

Kosim, Haeruddin

Exam: 
Ultrasound abdomen complete
INDICATION: Right upper quadrant pain with nausea.

[Series 1: abdomen · oblique · 0.33mm/px · 14 of 41 slices shown]
[im 1/41]
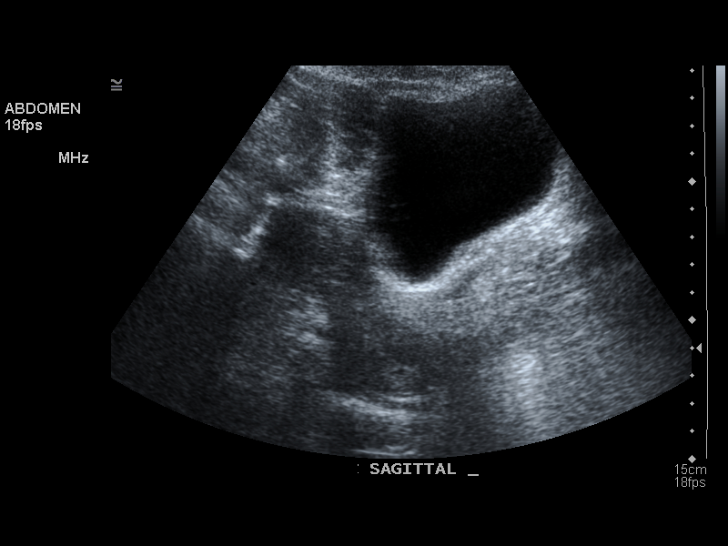
[im 4/41]
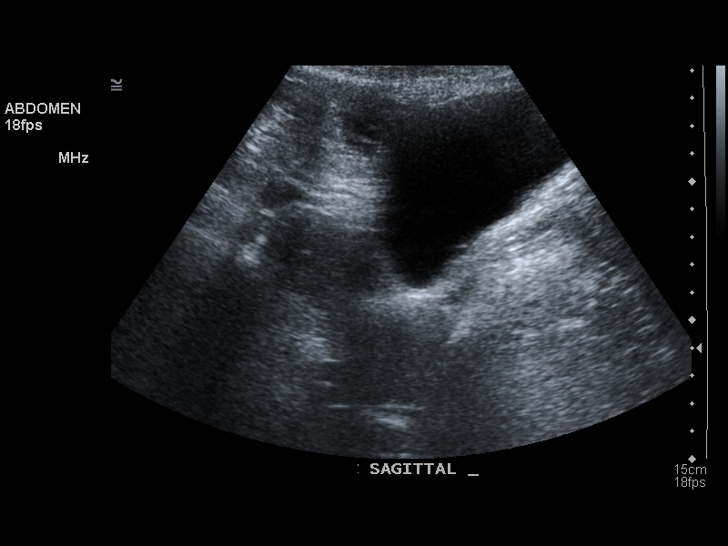
[im 7/41]
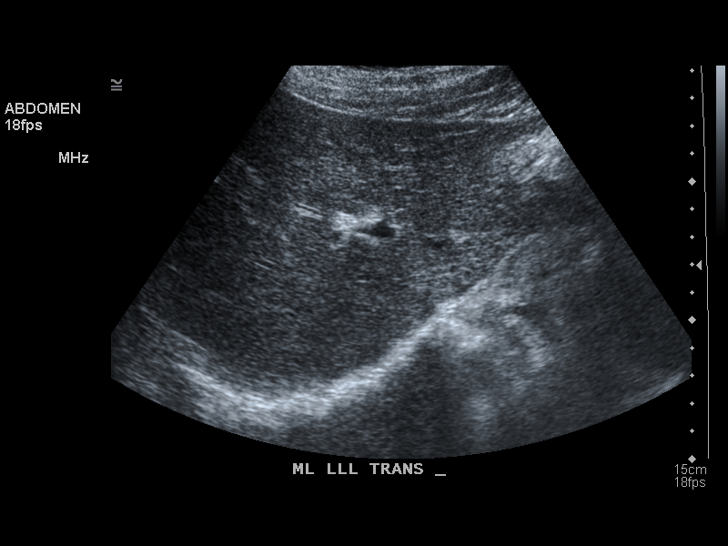
[im 11/41]
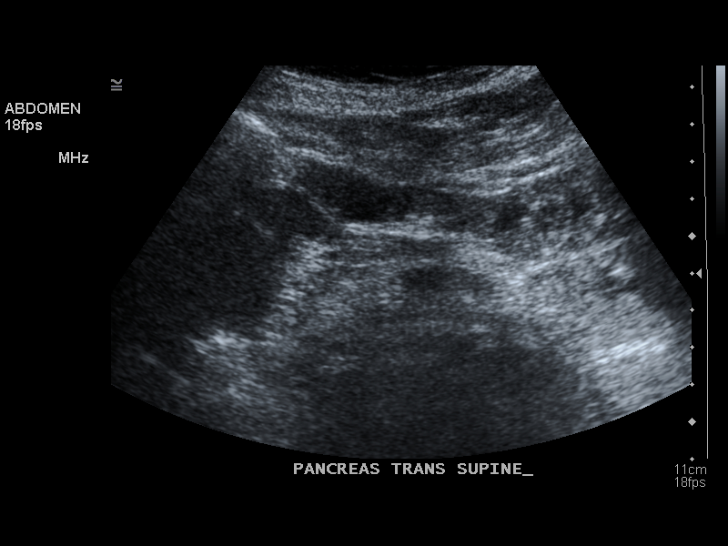
[im 14/41]
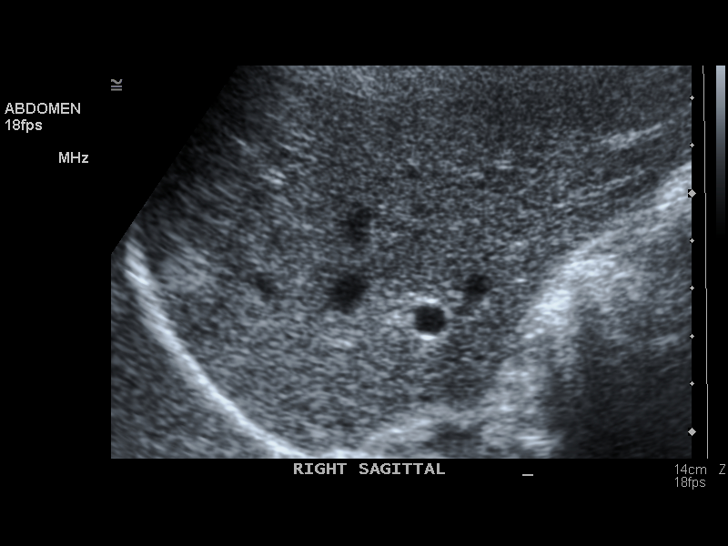
[im 16/41]
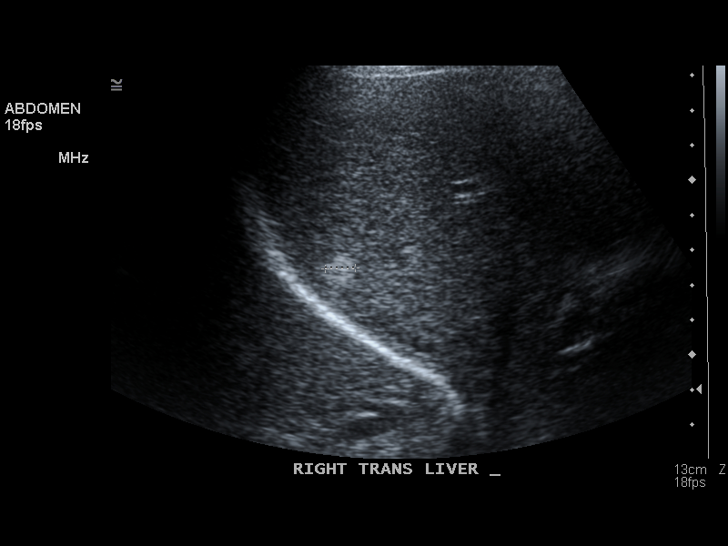
[im 19/41]
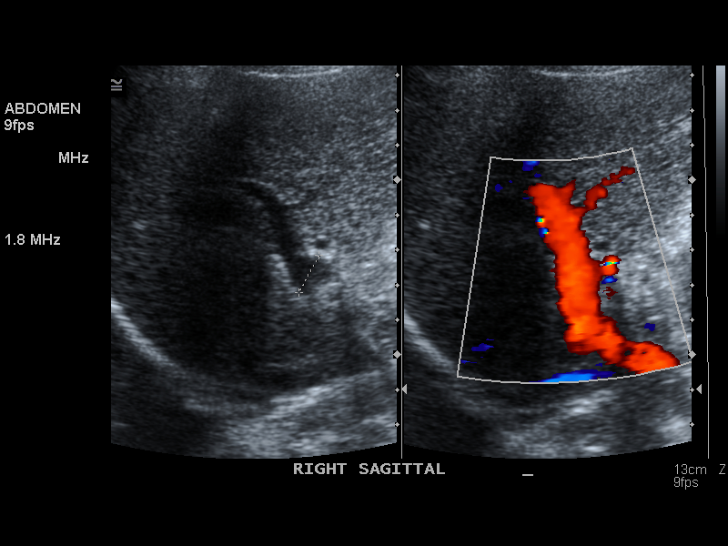
[im 22/41]
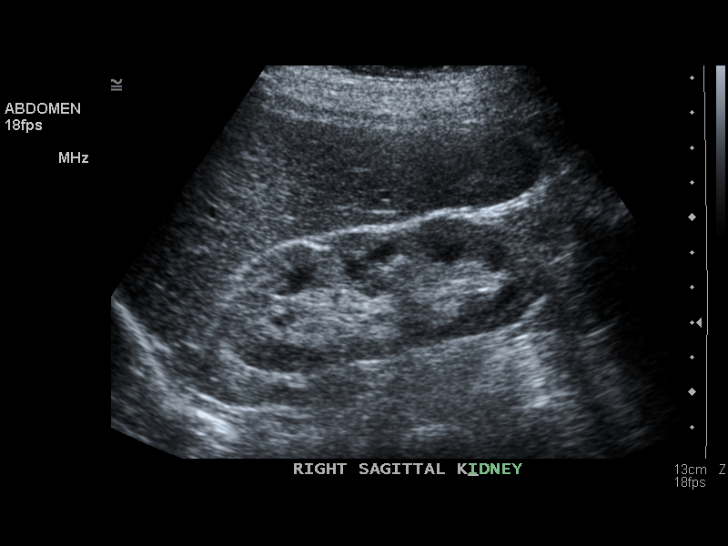
[im 26/41]
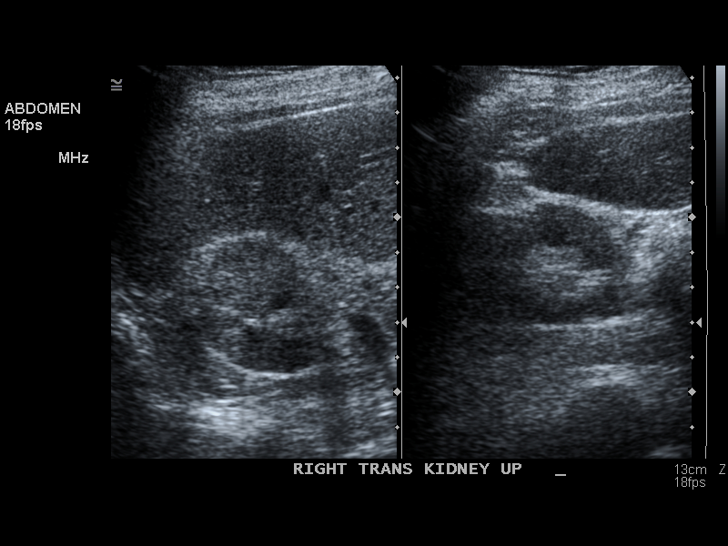
[im 27/41]
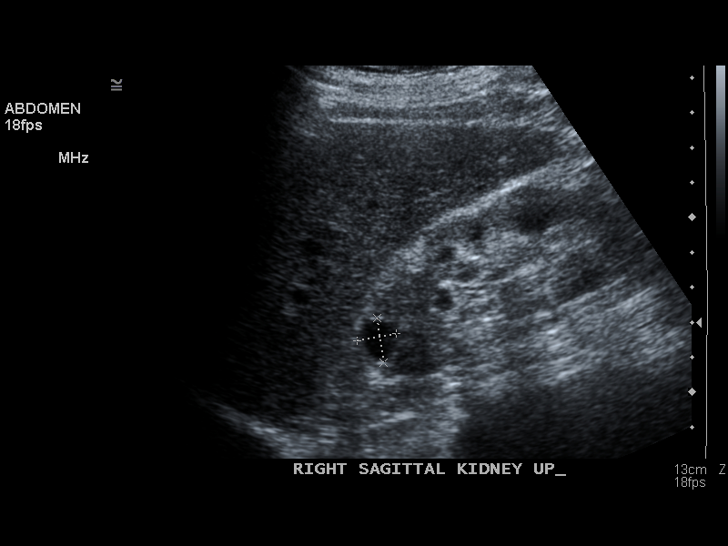
[im 31/41]
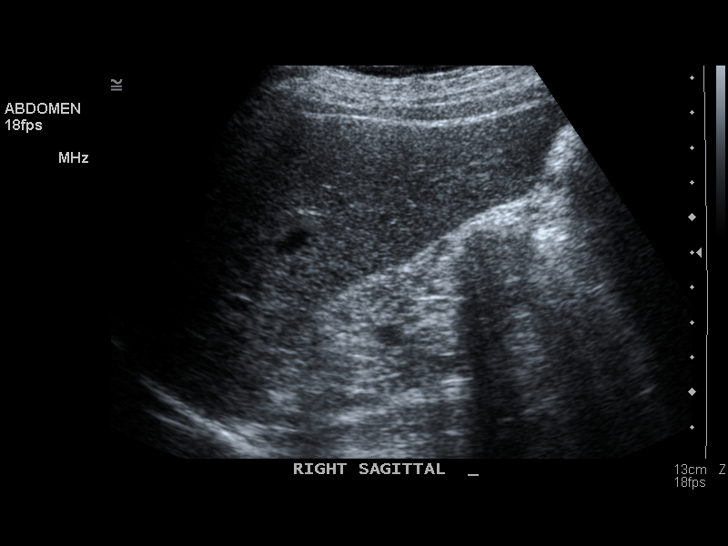
[im 34/41]
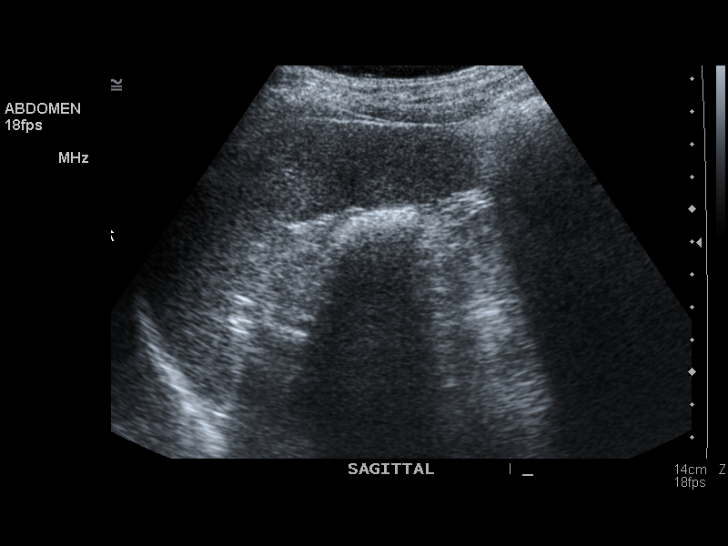
[im 37/41]
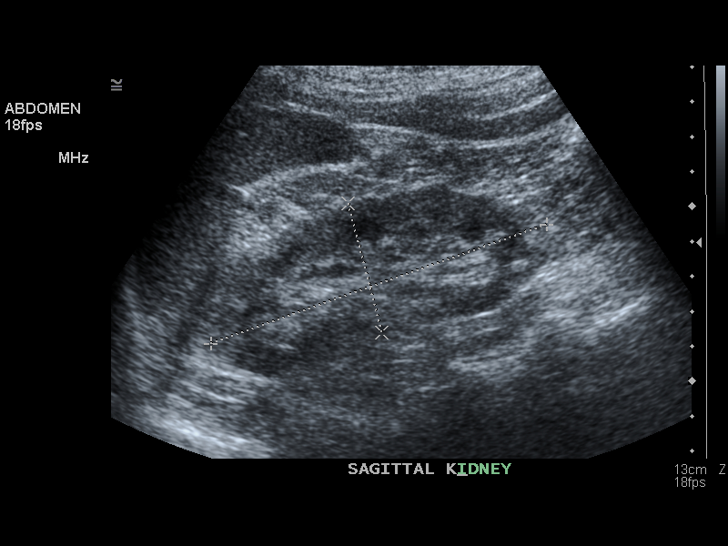
[im 41/41]
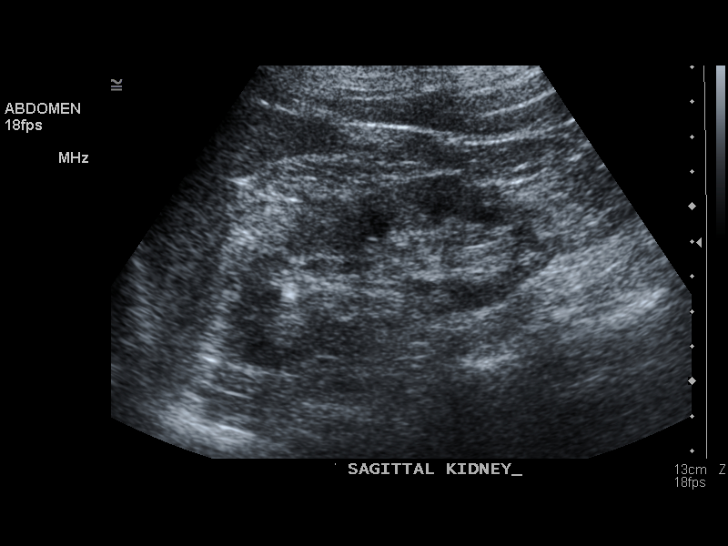

[14 of 25 positions shown; findings below may reference images not displayed]

FINDINGS: The liver is normal in echogenicity. There is a 1.1 cm echogenic nodule within the posterior right hepatic lobe, this may represent a hemangioma. There is no intra or extra hepatic biliary ductal dilatation. Common bile duct measures 4 mm. Gallbladder is surgically absent. Pancreas is incompletely visualized due to artifact from overlying bowel gas. Spleen measures 11.5 cm and is unremarkable. 
Kidneys are normal in echogenicity. Right kidney measures 9 cm and contains a 1.1 cm upper pole cyst. Left kidney measures 10 cm and is unremarkable. 
Visualized abdominal aorta is without aneurysmal dilatation. IVC is normal. There is no ascites.
IMPRESSION: 1.1 cm echogenic nodule within the posterior right hepatic lobe. This may represent a hemangioma. 
Prior cholecystectomy. 
Pancreas incompletely visualized due to artifact from overlying bowel gas. 
1.1 cm right kidney upper pole cyst. 

________________________________

## 2012-04-29 IMAGING — CR TSSP
1 series · 3 of 3 positions shown · non-contrast
Comparison: None.

Yb, Liow

Exam:
Thoracic Spine 2V
INDICATION: Pain.

[Series 1: view not recorded · oblique · 0.17mm/px · 3 of 3 slices shown]
[im 1/3]
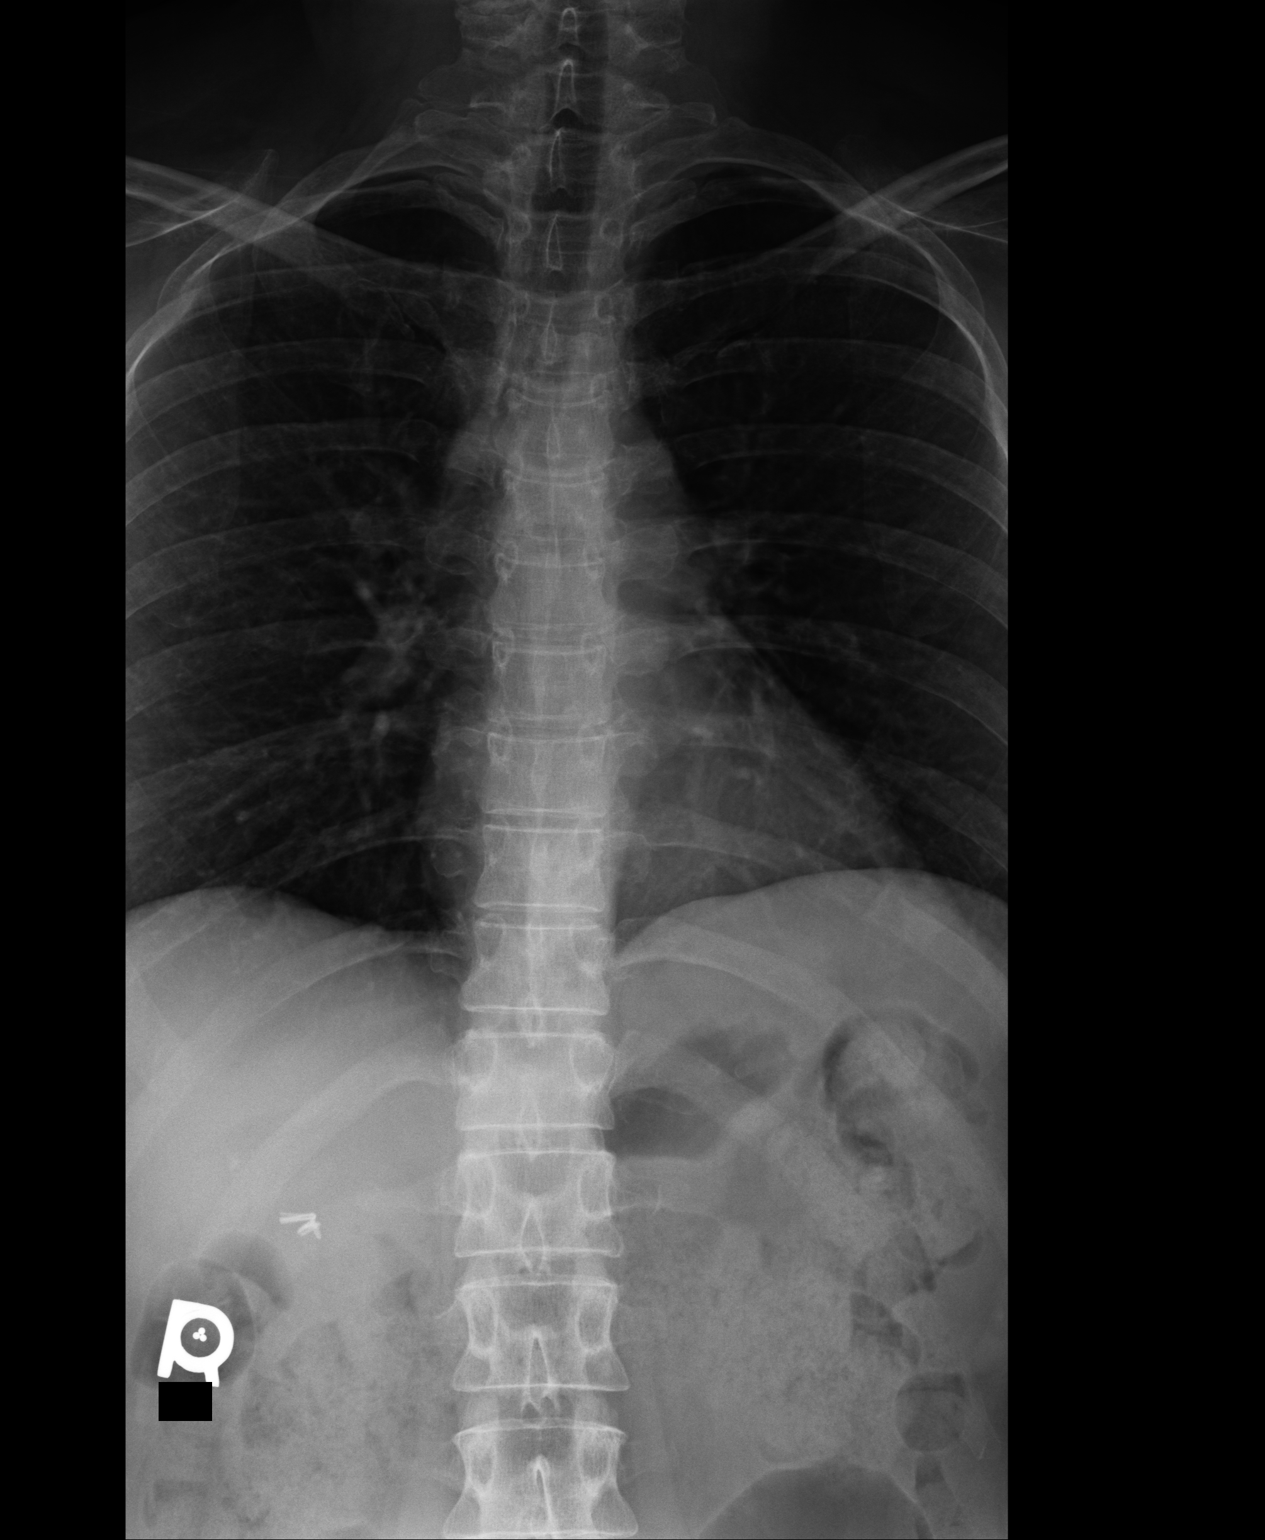
[im 2/3]
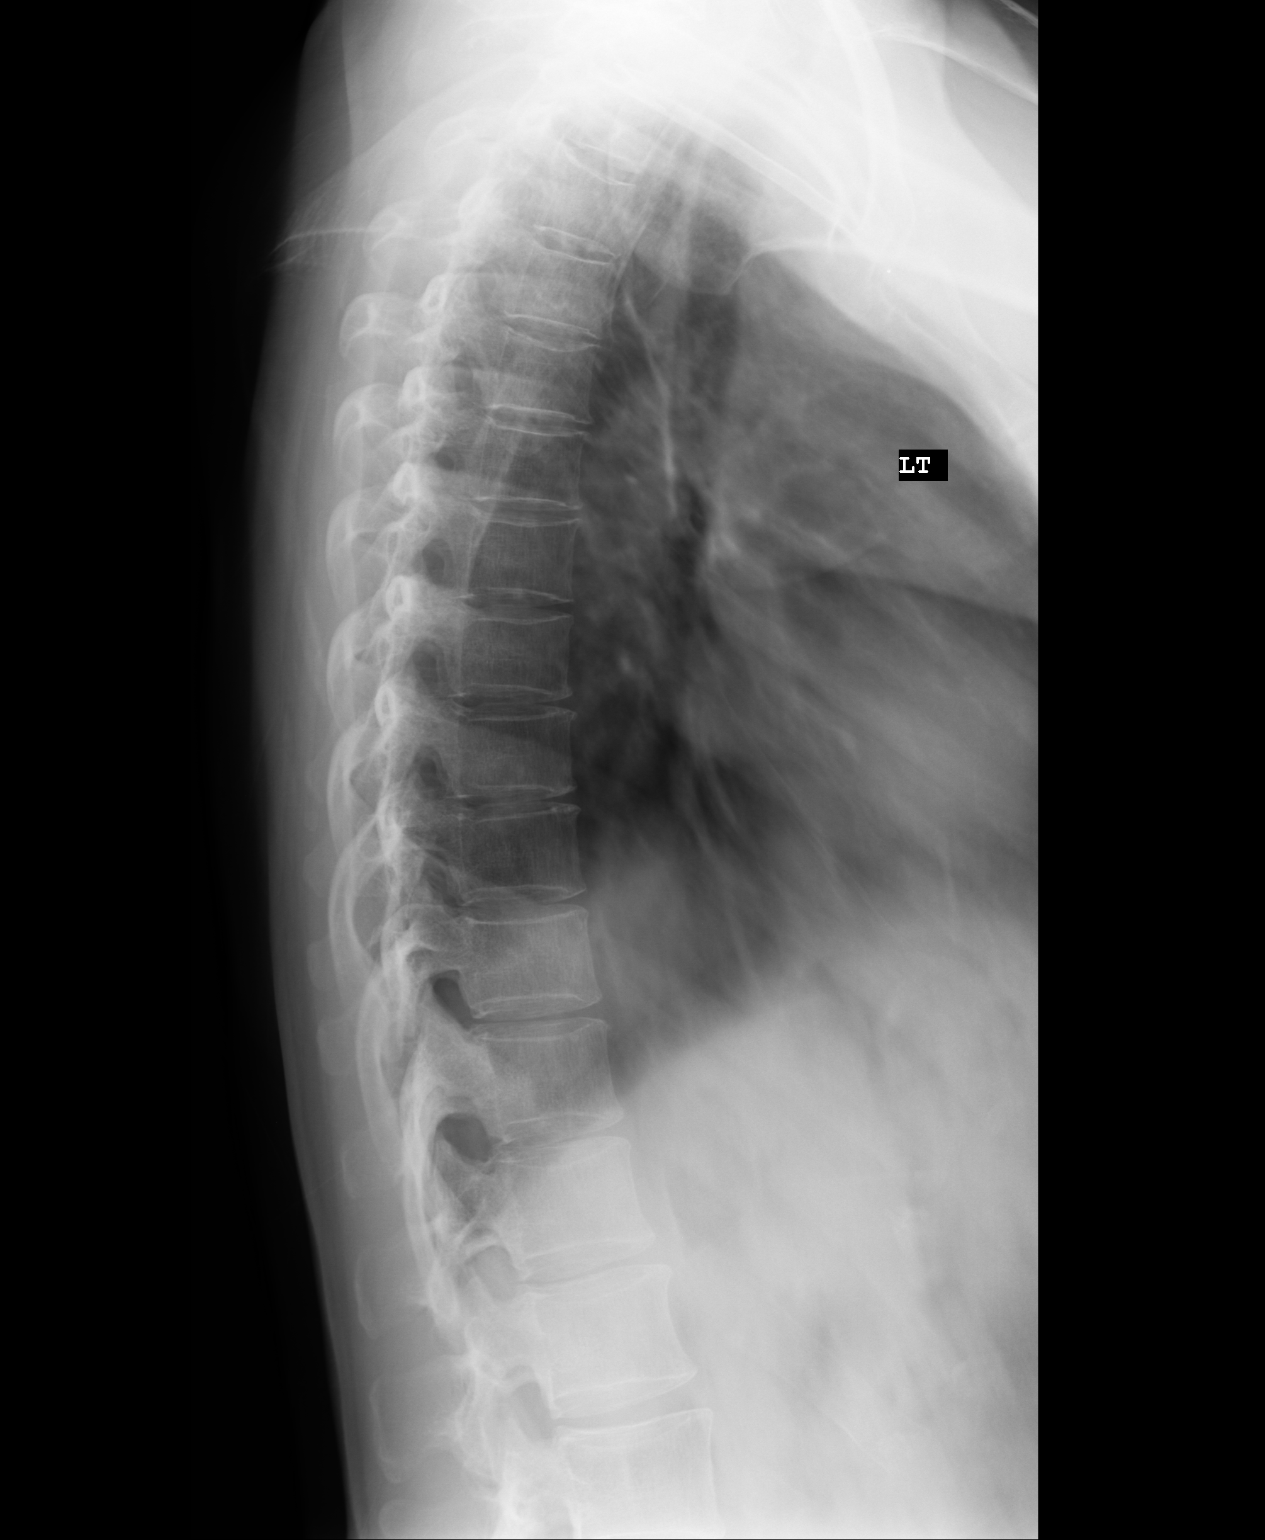
[im 3/3]
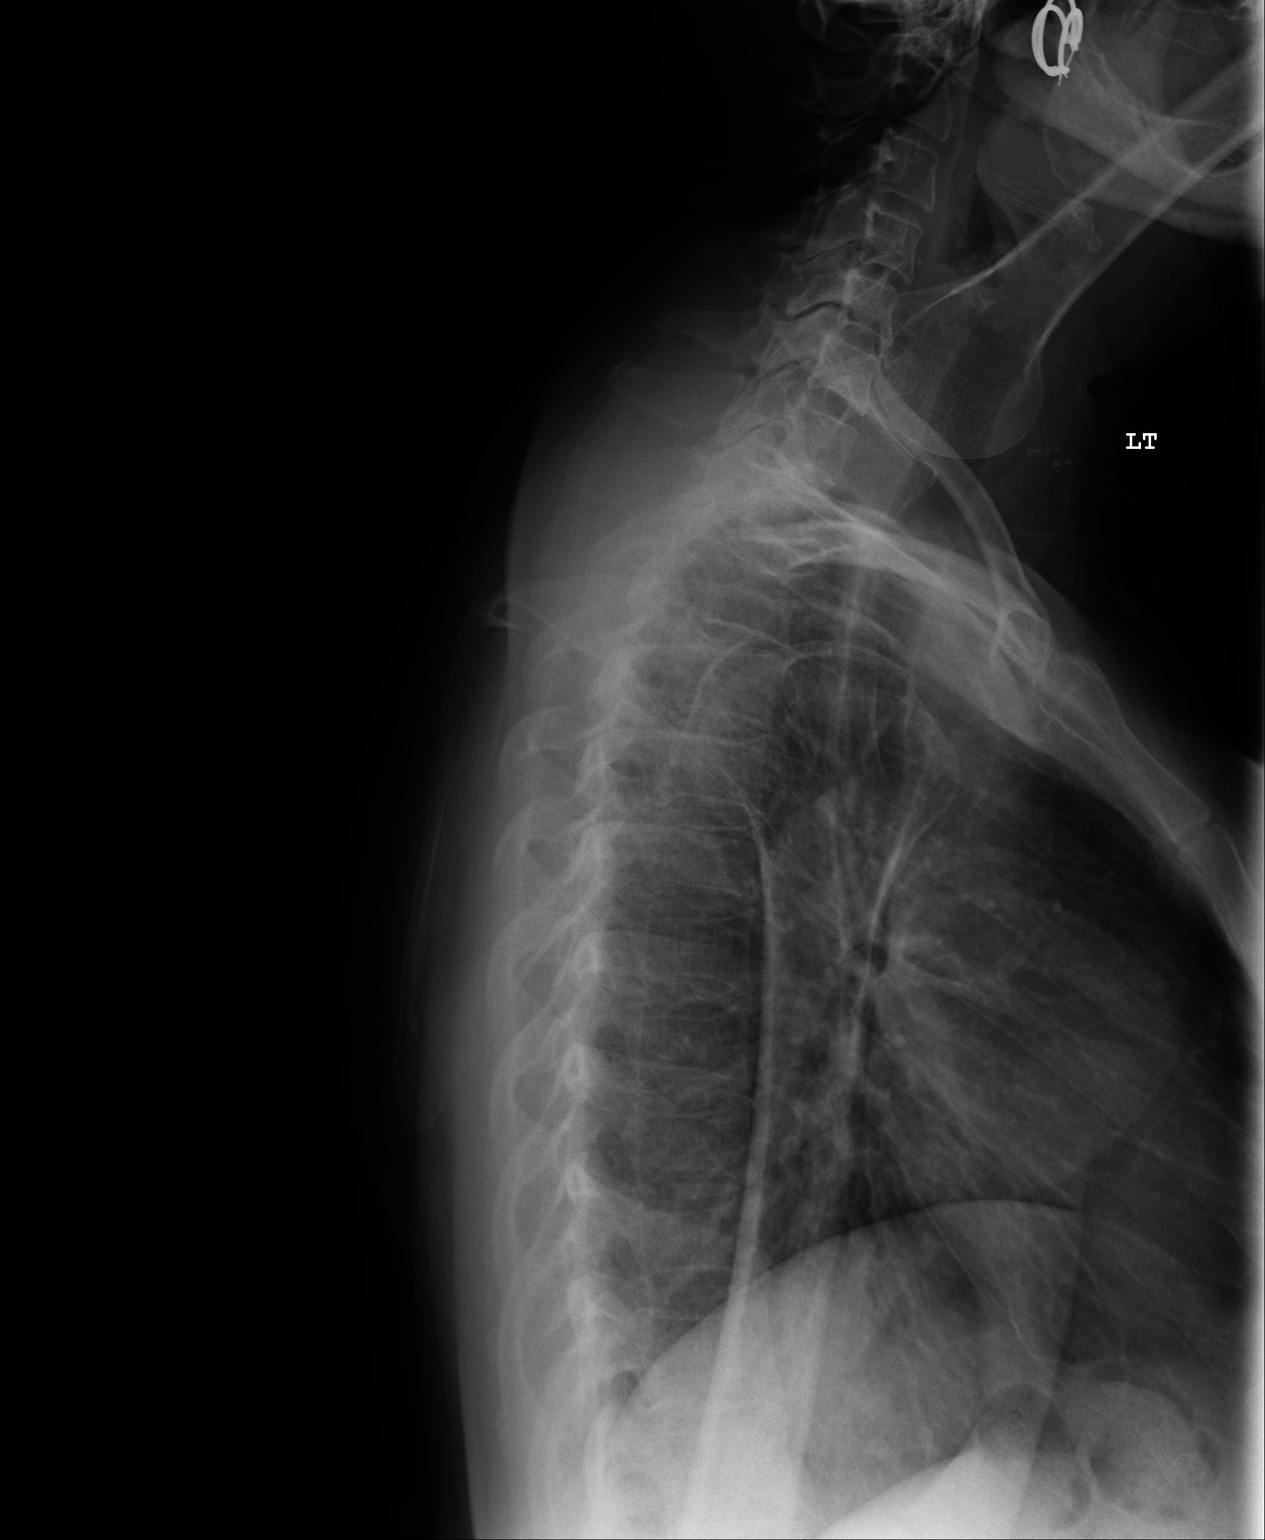

[3 of 3 positions shown; findings below may reference images not displayed]

FINDINGS: Lateral exam demonstrates normal height and alignment of the thoracic vertebral bodies. No fractures are seen.
IMPRESSION: Negative thoracic spine. 

________________________________

## 2012-06-29 IMAGING — CT CT ABDOMEN & PELVIS W/O DYE 1+ BODY REGNS
1 of 4 series · 9 of 32 positions shown, 15 images · IV contrast (APPLIED)
Comparison: Ultrasound abdomen dated 04/29/2012 and 04/03/2011.

Exam:

CT Abdomen and Pelvis without and with contrast, low dose Safe CT protocol
INDICATION: Liver hemangioma.
TECHNIQUE: Axial CT imaging of the abdomen and pelvis was performed without and with 100 cc of Optiray 300 as per liver mass protocol. Oral contrast was also administered. Additional reformatted sagittal and coronal projections were also obtained. Total examination DLP 727 mGy cm.

[Series 907: abd venous (safect) · axial · portal-venous · 0.74mm/px · z∈[-618,-240]mm · 9 of 158 slices shown, 15 images]
[im 16/158  soft-tissue]
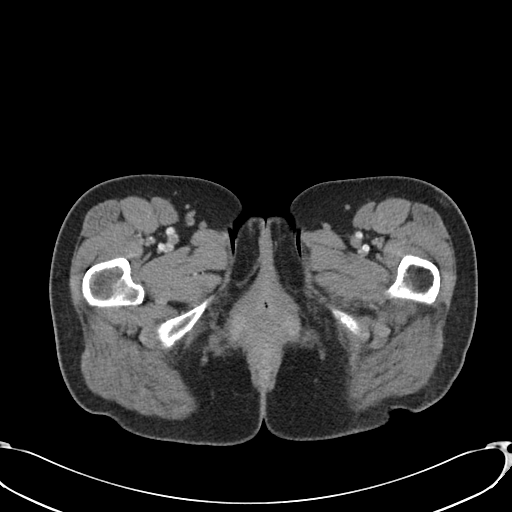
[im 16/158  bone]
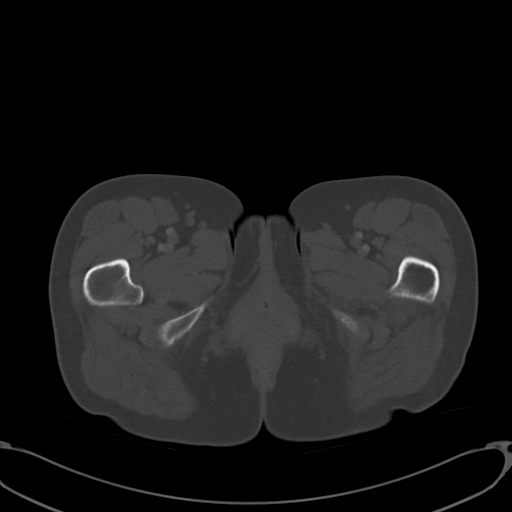
[im 32/158  soft-tissue]
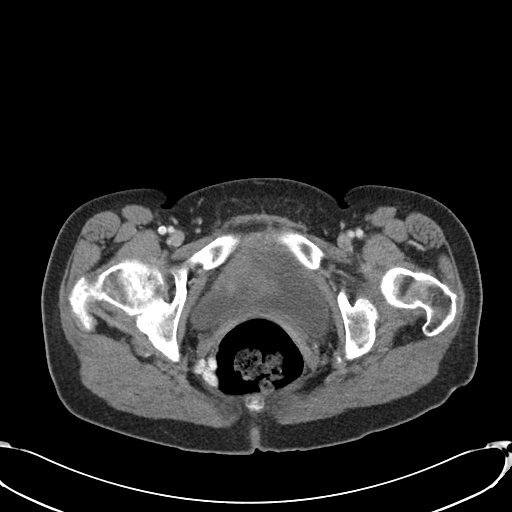
[im 48/158  soft-tissue]
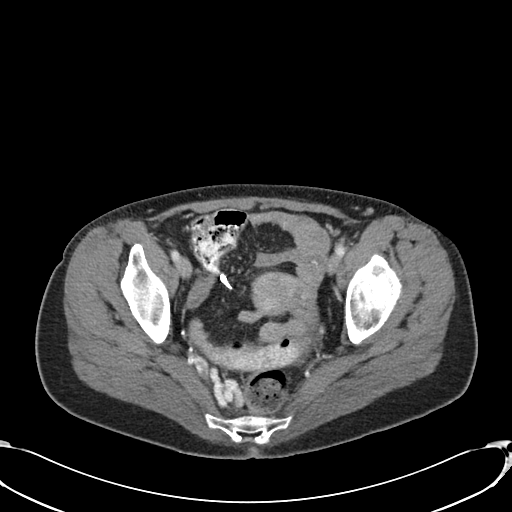
[im 63/158  soft-tissue]
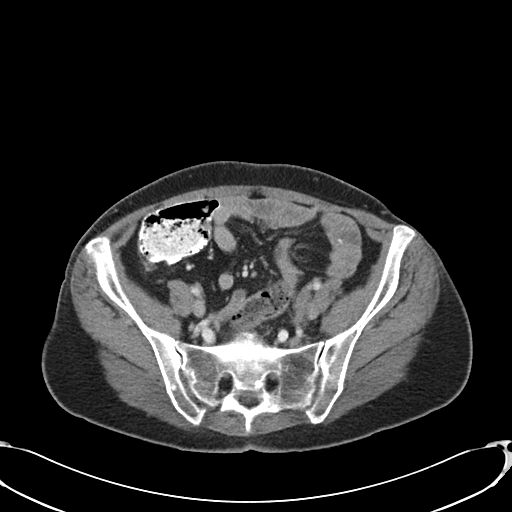
[im 79/158  soft-tissue]
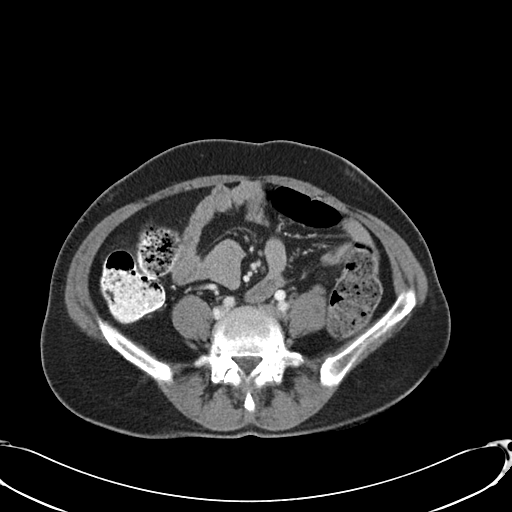
[im 95/158  soft-tissue]
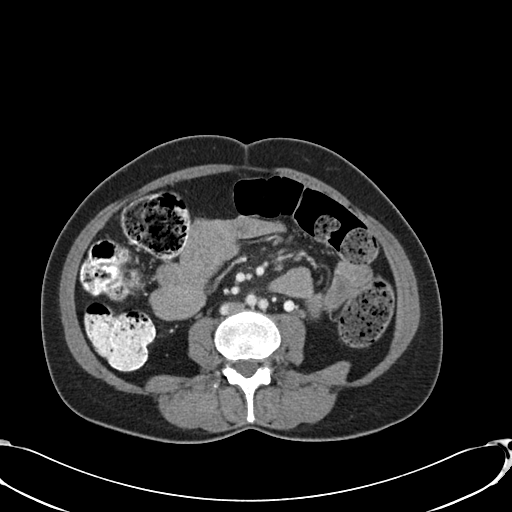
[im 95/158  lung]
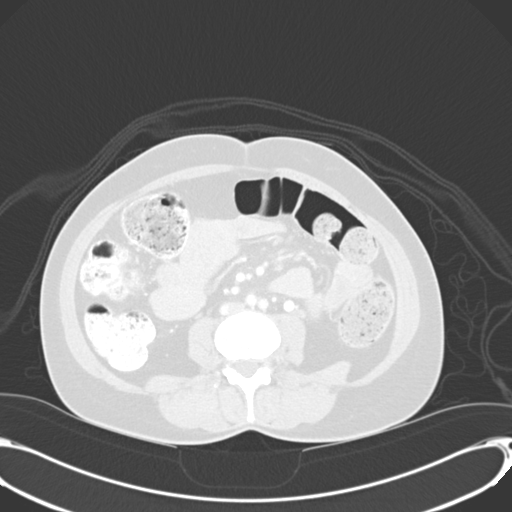
[im 110/158  soft-tissue]
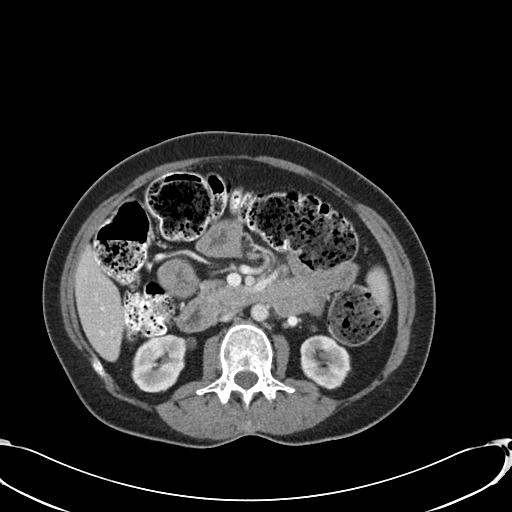
[im 110/158  lung]
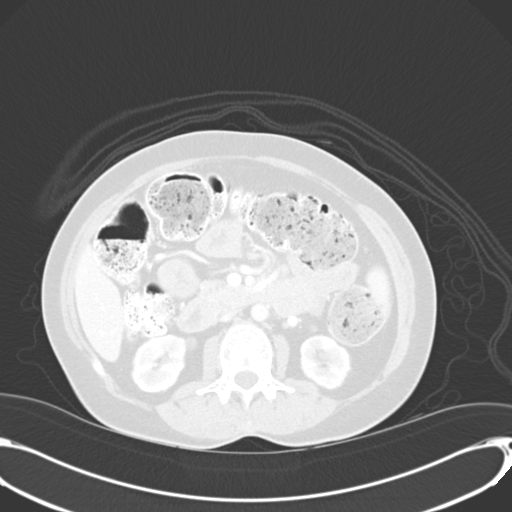
[im 126/158  soft-tissue]
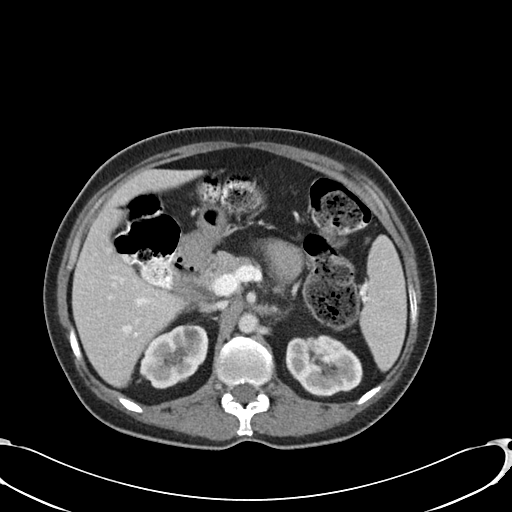
[im 126/158  lung]
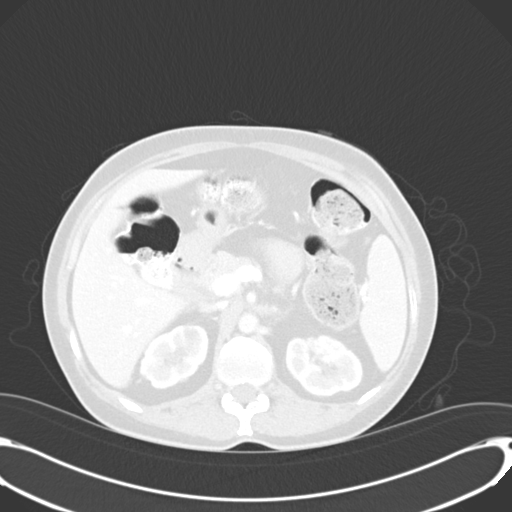
[im 142/158  soft-tissue]
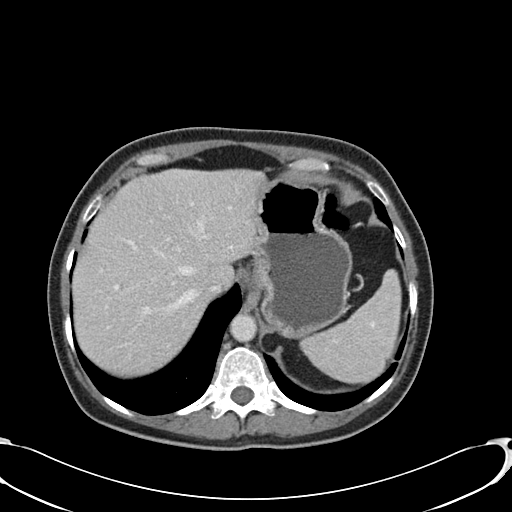
[im 142/158  lung]
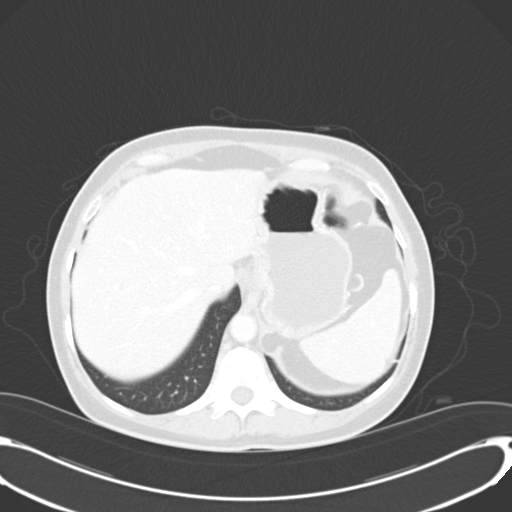
[im 142/158  bone]
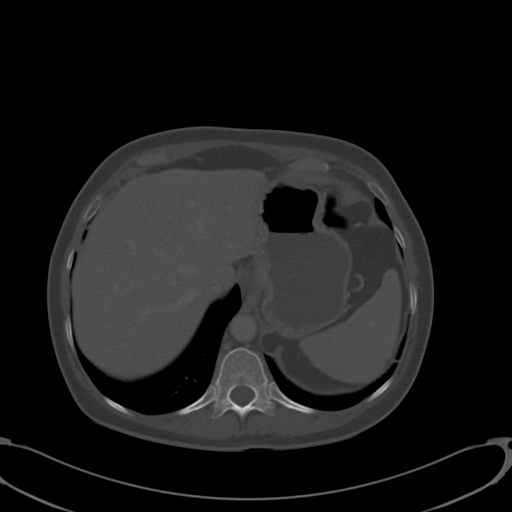

[9 of 32 positions shown; findings below may reference images not displayed]

FINDINGS: ABDOMEN:

Limited visualization of lung bases is unremarkable. There is no pleural or pericardial effusion. 

There is a probable 6 mm hemangioma within the superior right hepatic lobe. There is also a 1.4 cm right kidney upper pole cyst. Gallbladder is surgically absent. Spleen, pancreas, adrenal glands and left kidney are normal. Bowel loops are normal in course and caliber; there is no obstruction or free air. There is no adenopathy. There are scattered vascular calcifications. 

PELVIS:

There is no acute inflammatory process. Appendix is not definitely visualized and there are no secondary signs to suggest acute appendicitis. Tubal ligation clips are also noted. There is a large amount of stool throughout the colon. There is no pelvic free fluid. There is no adenopathy. Osseous structures are unremarkable.
IMPRESSION: 1.
A probable 6 mm hemangioma within the superior right hepatic lobe. 

2.
No acute abnormality within the upper abdomen and pelvis. 

3.
Large amount of stool throughout the colon. 

________________________________

[DATE] [DATE]

## 2014-03-09 IMAGING — MG MAMMO DIGITAL SCREENING
1 series · 6 of 6 positions shown · non-contrast
Comparison: 

------------- REPORT [id number] -------------
MAMMO DIGITAL SCREENING WITH CAD
Exam:  
Screening digital mammogram with CAD
INDICATION: Annual screening.

[R CC · oblique · right · 6 of 6 slices shown]
[im 1/6]
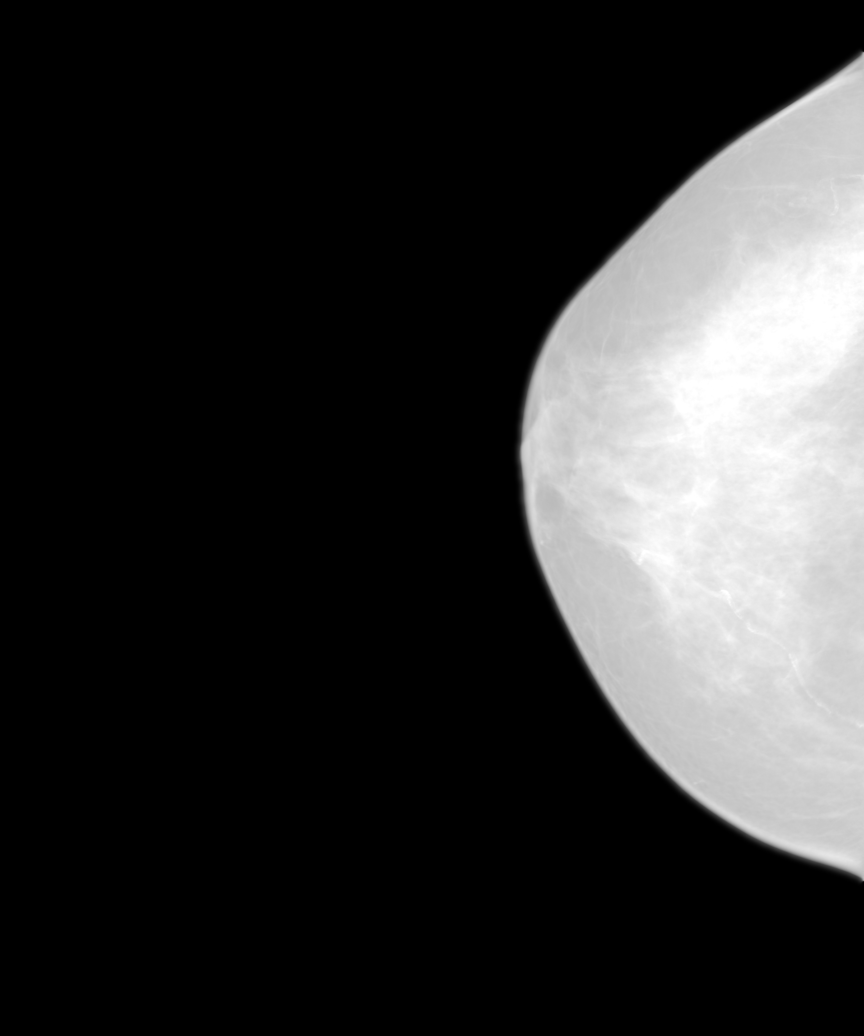
[im 2/6]
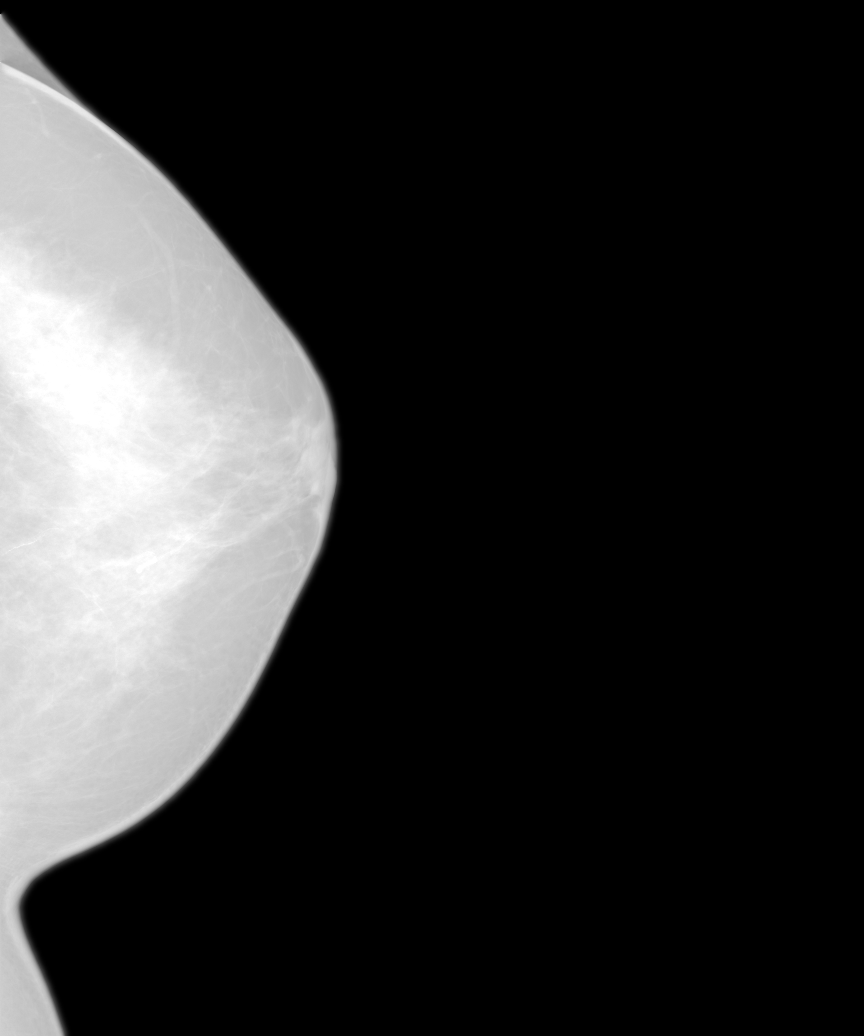
[im 3/6]
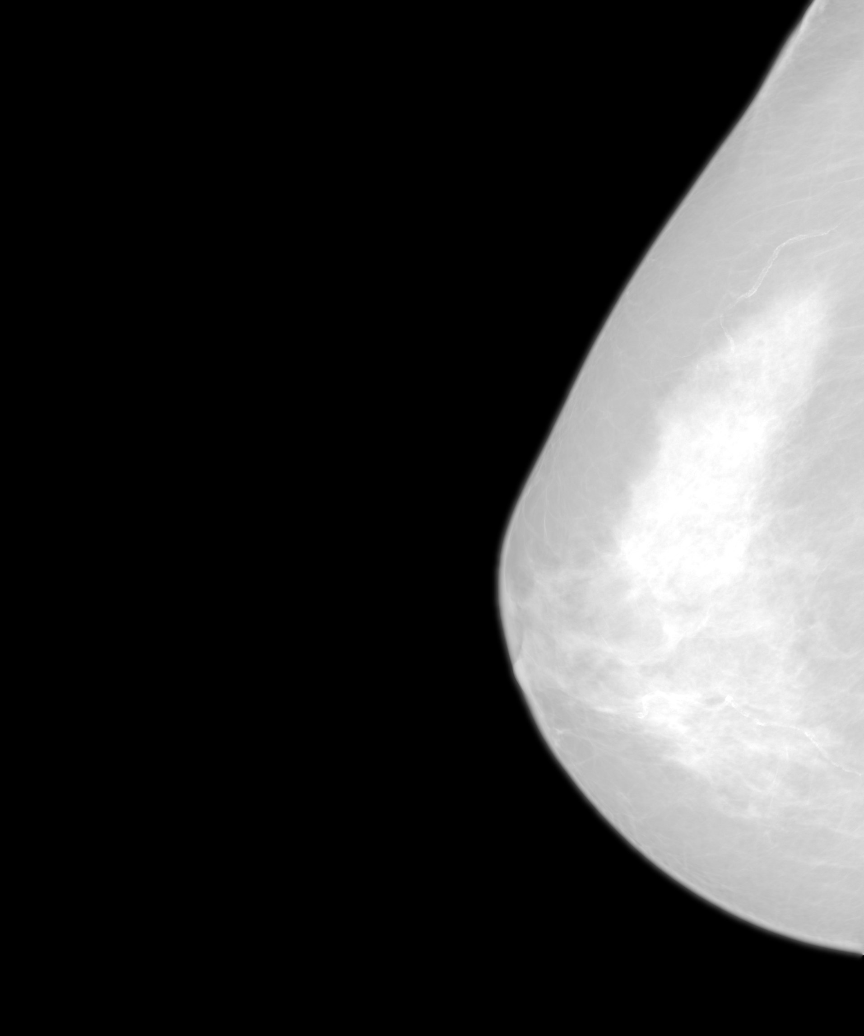
[im 4/6]
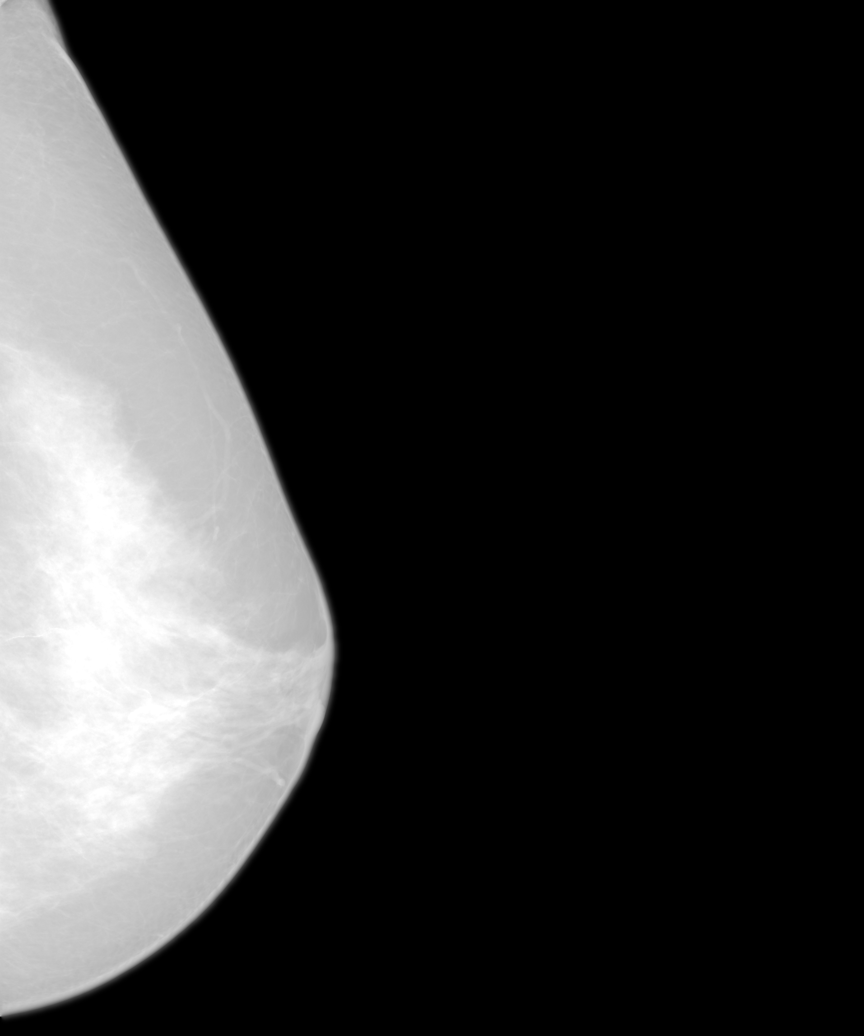
[im 5/6]
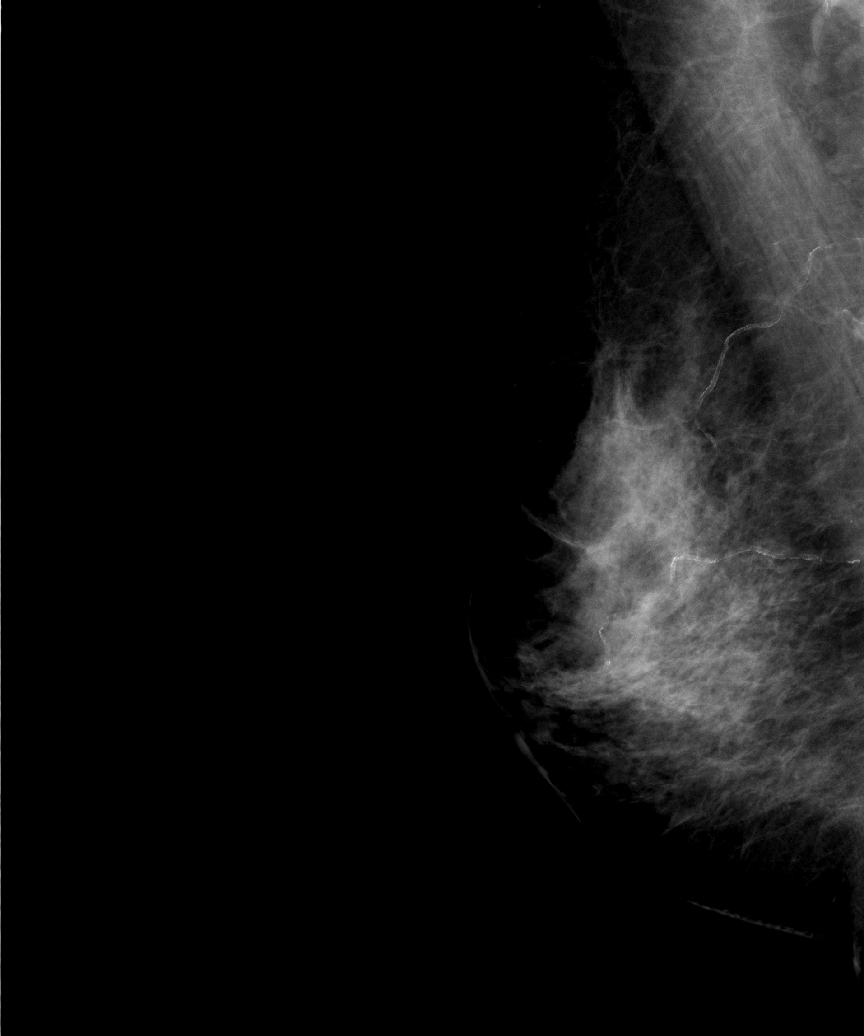
[im 6/6]
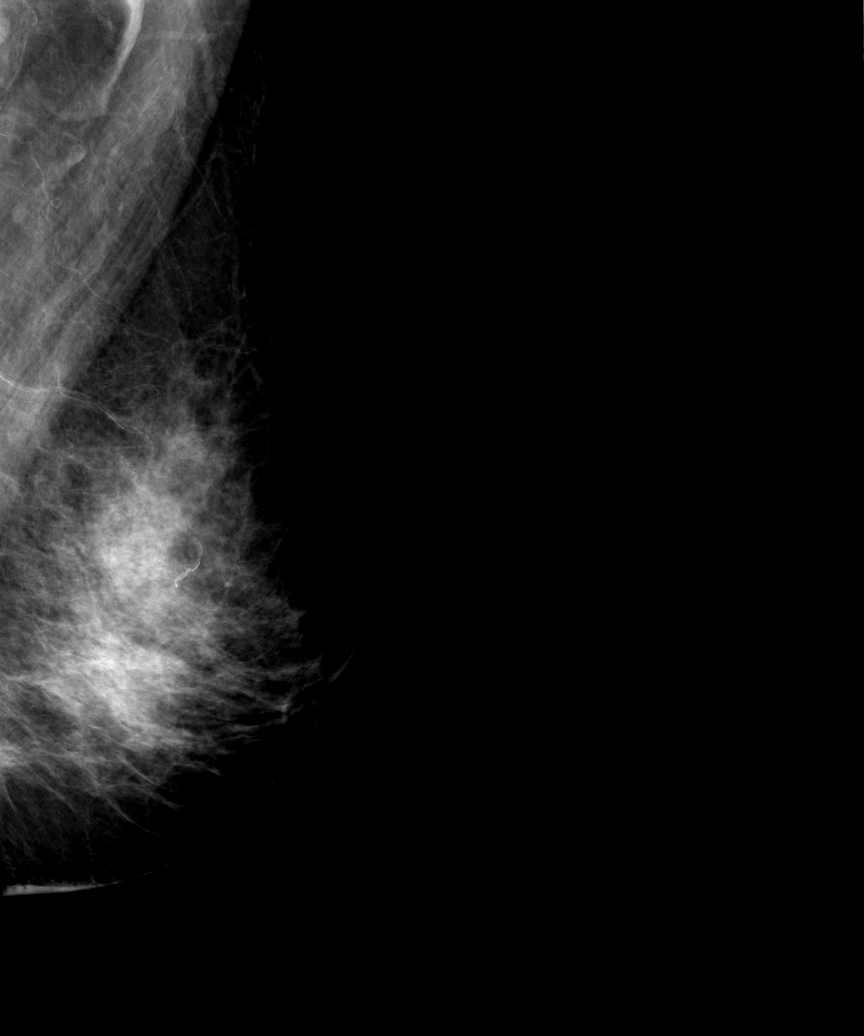

[6 of 6 positions shown; findings below may reference images not displayed]

FINDINGS: Breast parenchyma is heterogeneously dense. There is no mass or suspicious cluster of microcalcifications. There is no architectural distortion, skin thickening or nipple retraction.
IMPRESSION: 1.
BIRADS 2-Benign findings. Patient has been added in a reminder system with a
target date for the next screening mammography.
2.
DENSITY CODE   C (Heterogeneously dense)
Final Assessment Code:
Bi-Rads 2 

BI-RADS 0
Need additional imaging evaluation
BI-RADS 1
Negative mammogram
BI-RADS 2
Benign finding
BI-RADS 3
Probably benign finding: short-interval follow-up suggested
BI-RADS 4
Suspicious abnormality:  biopsy should be considered
BI-RADS 5
Highly suggestive of malignancy; appropriate action should be taken
BI-RADS 6
Known Biopsy-proven Malignancy  Appropriate action should be taken
NOTE:
In compliance with Federal regulations, the results of this mammogram are being sent to the patient.

------------- REPORT [id number] -------------
[facility]
[address]
Mor Ms.EPRES, ADRIENN ALEXA:
We wish to report the following on your recent mammography examination. We are sending a report to your referring physician or other health care provider. 
(       Normal/Negative:
No evidence of cancer.
This statement is mandated by the [place], Department of Health.
Your examination was performed by one of our technologists, who are registered radiological technologists and also specially certified in mammography:
___
Donjuan, Wavy (M)
___
Tena, Danielf (M)
___
Gia, Nelith (M)

Your mammogram was interpreted by our radiologist.

( 
Mmamontsho Seakolo, M.D.

(Annual Breast Examination by a physician or other health care provider
(Annual Mammography Screening beginning at age 40
(Monthly Breast Self Examination

## 2015-08-30 ENCOUNTER — Ambulatory Visit (HOSPITAL_COMMUNITY): Payer: Self-pay

## 2020-11-26 IMAGING — MR MRI LUMBAR SPINE WITHOUT CONTRAST
8 series · 48 of 48 positions shown · IV contrast (gadolinium)
Comparison: MRI lumbar spine dated 01/05/2017 and thoracic spine dated 12/09/2018.

﻿EXAM:  [id number]   MRI LUMBAR SPINE WITHOUT CONTRAST
INDICATION: Persistent lower back pain with bilateral lower extremity radiculopathy.
TECHNIQUE: Multiplanar multisequential MRI of the lumbar spine was performed without gadolinium contrast.

[Series 4: s-map · sagittal · 10.6mm · 5.31mm/px · 22 of 100 slices shown]
[im 1/100]
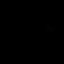
[im 5/100]
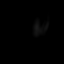
[im 10/100]
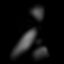
[im 15/100]
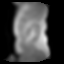
[im 19/100]
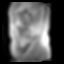
[im 24/100]
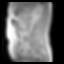
[im 29/100]
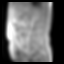
[im 34/100]
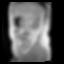
[im 38/100]
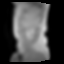
[im 43/100]
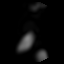
[im 48/100]
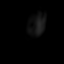
[im 52/100]
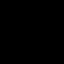
[im 57/100]
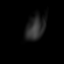
[im 62/100]
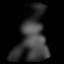
[im 67/100]
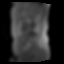
[im 71/100]
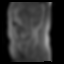
[im 76/100]
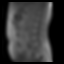
[im 81/100]
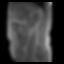
[im 85/100]
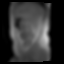
[im 90/100]
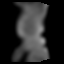
[im 95/100]
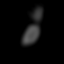
[im 100/100]
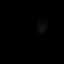

[Series 5: T2 · sagittal · 4.0mm · 1.13mm/px · 3 of 13 slices shown (1 of 3)]
[im 1/13]
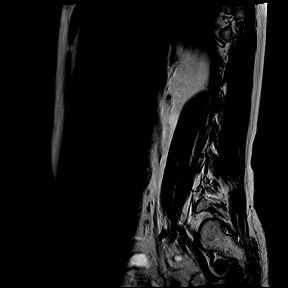
[im 7/13]
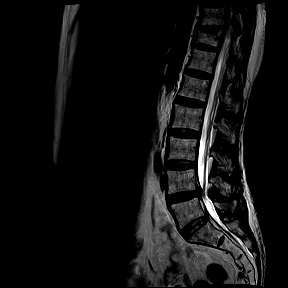
[im 13/13]
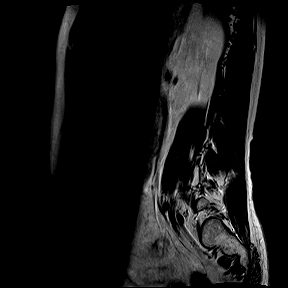

[Series 6: T1 · sagittal · 4.0mm · 0.94mm/px · 3 of 13 slices shown (1 of 3)]
[im 1/13]
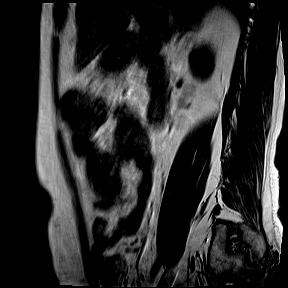
[im 7/13]
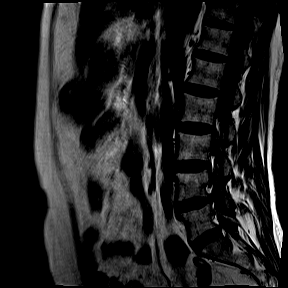
[im 13/13]
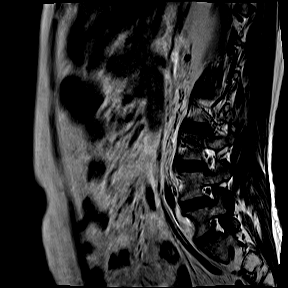

[Series 7: STIR · sagittal · 4.0mm · 1.27mm/px · 3 of 13 slices shown]
[im 1/13]
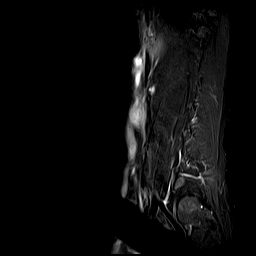
[im 7/13]
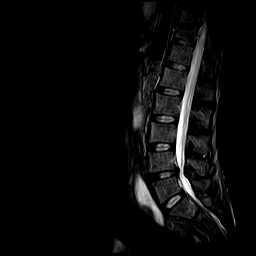
[im 13/13]
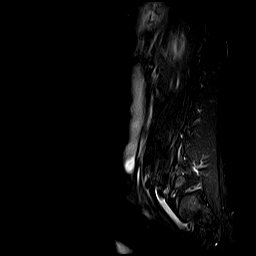

[Series 8: T2 · axial · 4.0mm · 0.52mm/px · z∈[-149,+71]mm · 5 of 23 slices shown (2 of 3)]
[im 1/23]
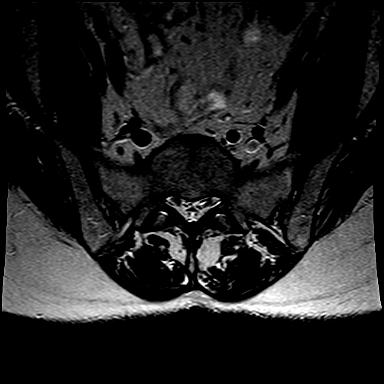
[im 6/23]
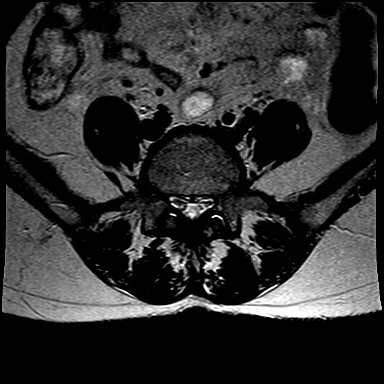
[im 12/23]
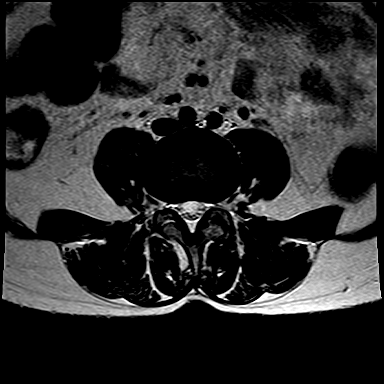
[im 17/23]
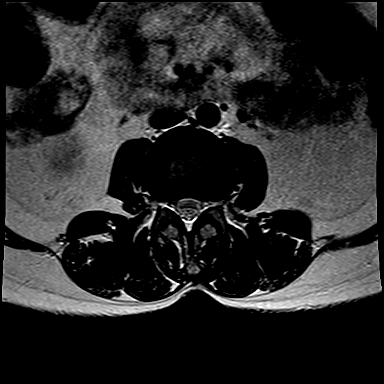
[im 23/23]
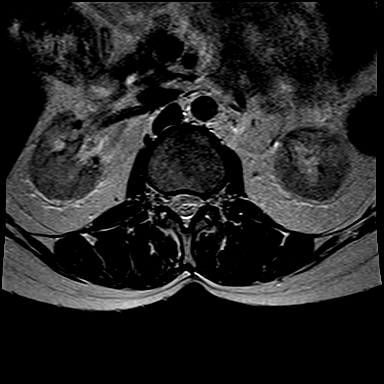

[Series 9: T1 · axial · 4.0mm · 0.52mm/px · z∈[-149,+71]mm · 5 of 23 slices shown (2 of 3)]
[im 1/23]
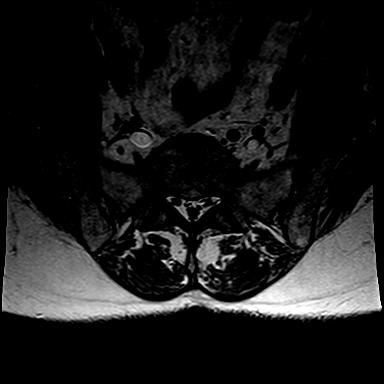
[im 6/23]
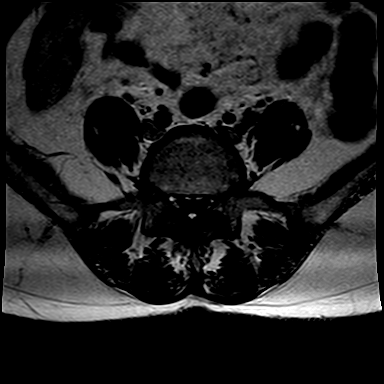
[im 12/23]
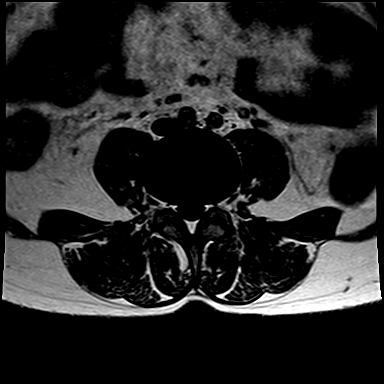
[im 17/23]
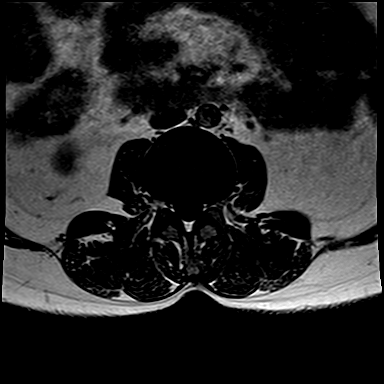
[im 23/23]
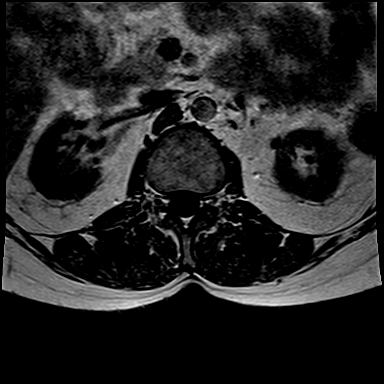

[Series 10: T1 · axial · 4.0mm · 0.52mm/px · z∈[+116,+171]mm · 3 of 12 slices shown (3 of 3)]
[im 1/12]
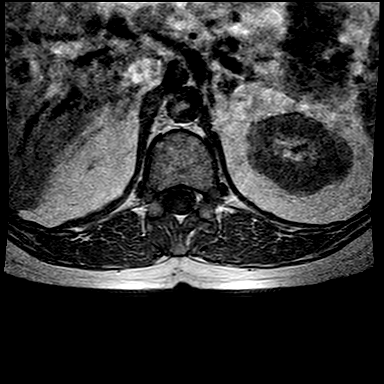
[im 6/12]
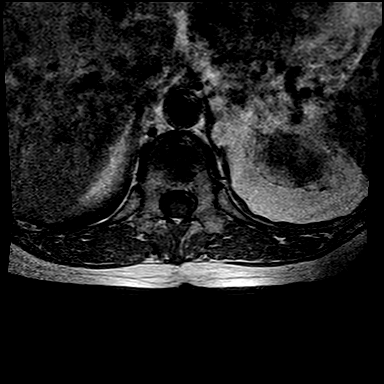
[im 12/12]
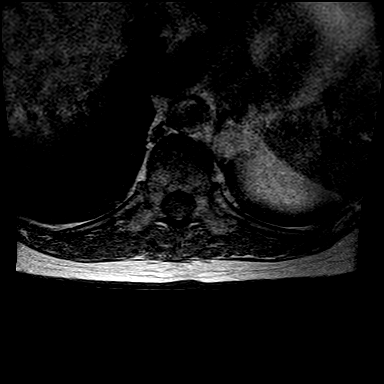

[Series 11: T2 · coronal · 5.0mm · 0.82mm/px · 4 of 18 slices shown (3 of 3)]
[im 1/18]
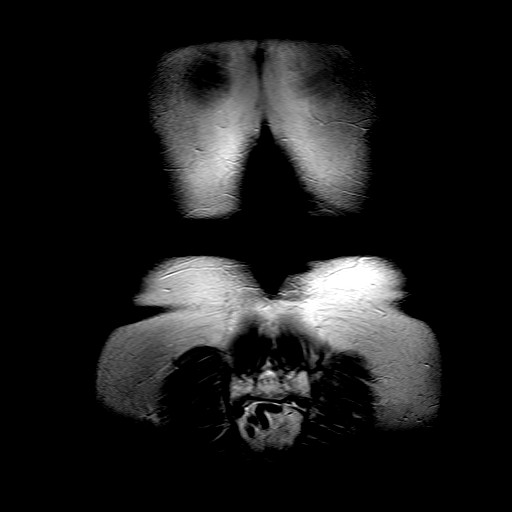
[im 6/18]
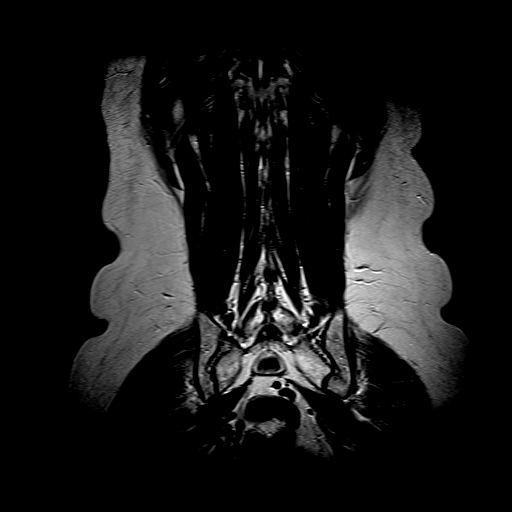
[im 12/18]
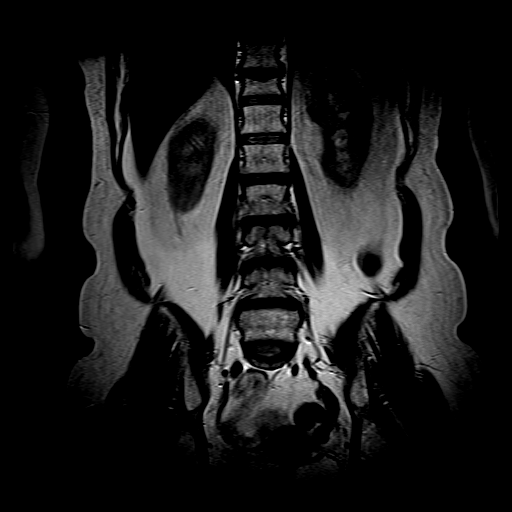
[im 18/18]
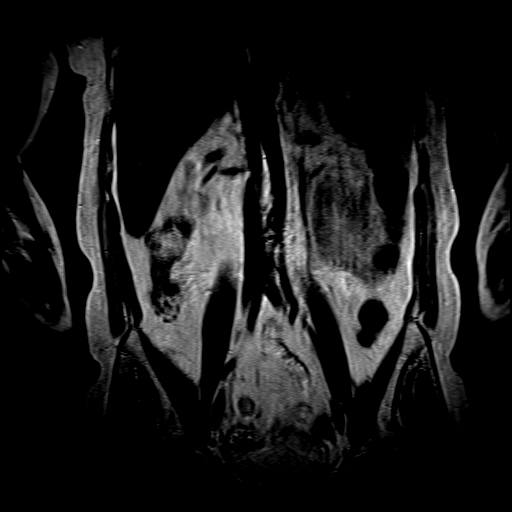

[48 of 48 positions shown; findings below may reference images not displayed]

FINDINGS: Lumbar vertebral bodies are normal in height, alignment and signal intensity. There is no acute fracture or subluxation. Distal spinal cord is normal in signal intensity and terminates normally at T12-L1 disc space level. Spinal canal is congenitally narrow.

At L1-2 level, there is mild-to-moderate bilateral neural foraminal stenosis from bulging annulus.

There is mild bilateral neural foraminal stenosis at L2-3 and L3-4 levels from facet arthropathy and bulging annulus.

At L4-5 level, there is a small broad-based central disc bulge resulting in severe spinal stenosis. There is moderate bilateral neural foraminal stenosis from facet arthropathy and bulging annulus.

L5-S1 level and paraspinal soft tissues appear unremarkable.
IMPRESSION: 1. Severe spinal stenosis at L4-5 level from a small central disc bulge. 

2. Multilevel neural foraminal stenosis as detailed above.

## 2020-12-27 IMAGING — MG 3D SCREENING MAMMO BIL W/CAD & TOMO
5 series · 8 of 24 positions shown · non-contrast
Comparison: 01/20/2020

------------- REPORT [id number] -------------
﻿

EXAM:  3D BILATERAL ANNUAL SCREENING DIGITAL MAMMOGRAM WITH CAD AND TOMOSYNTHESIS
INDICATION: Screening.

[L]
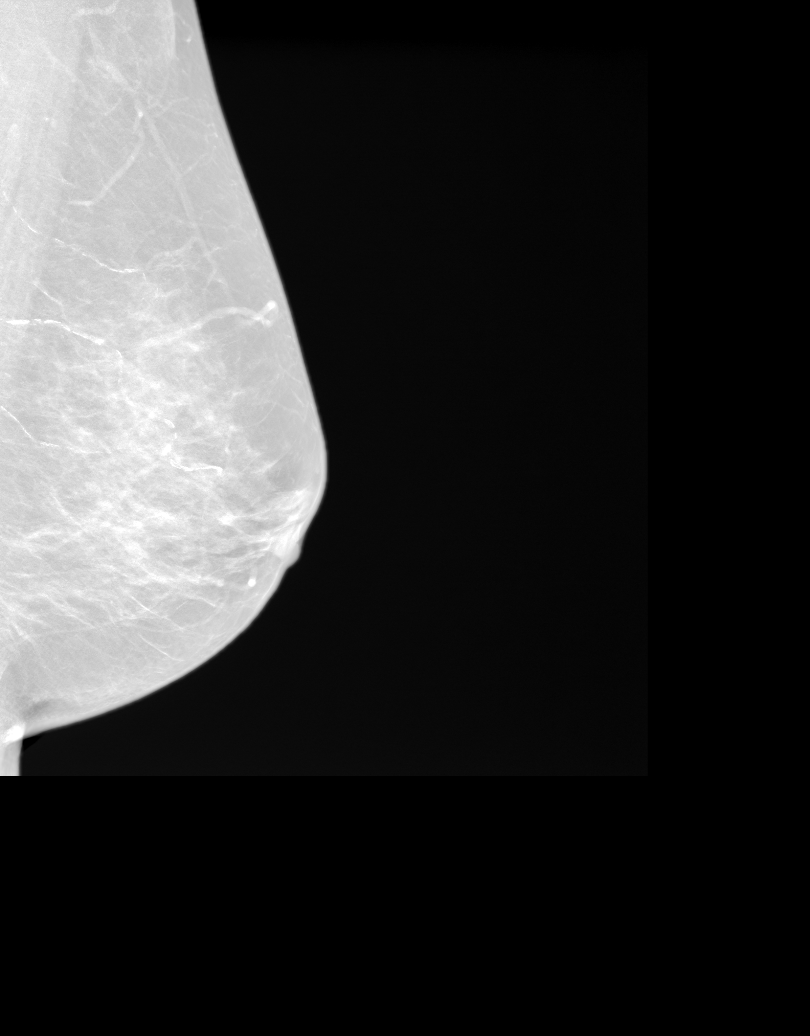

[R]
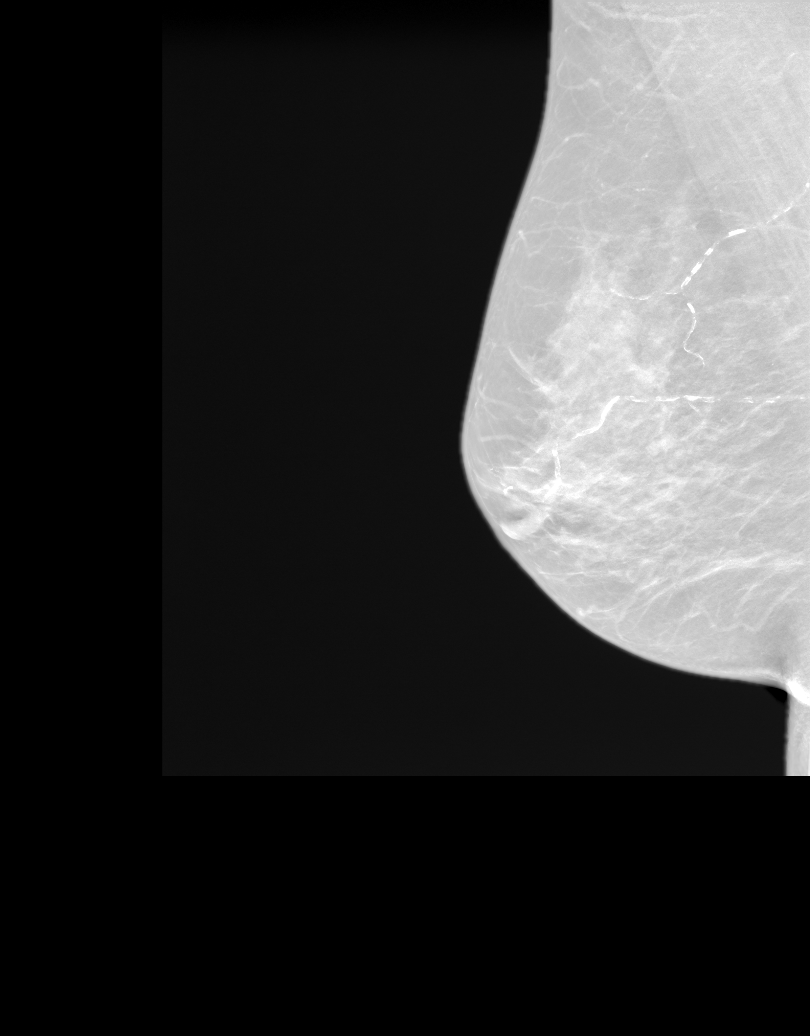

[R CC tomo · right · 0.10mm/px · 2 of 2 slices shown]
[im 1/2]
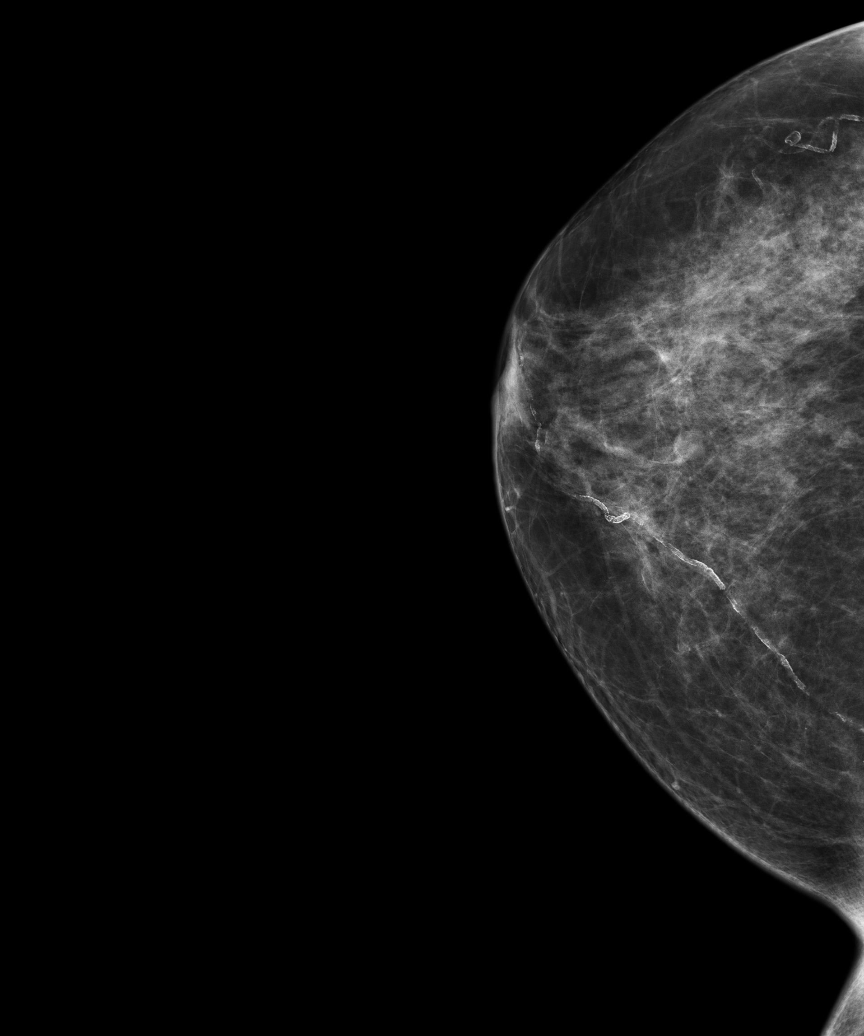
[im 2/2]
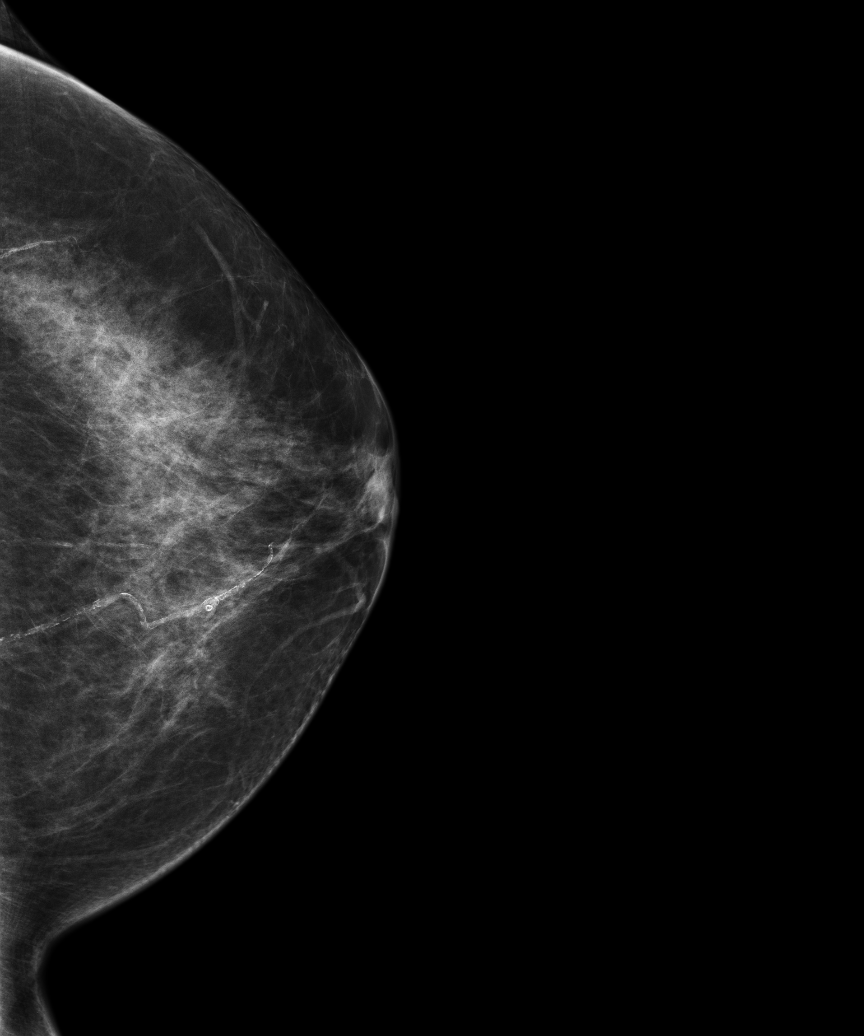

[3D SCREENING MAMMO BIL W/CAD & TOMO tomo · 2 acquisitions, 3 frames shown (1 of 2)]
[im 1/2]
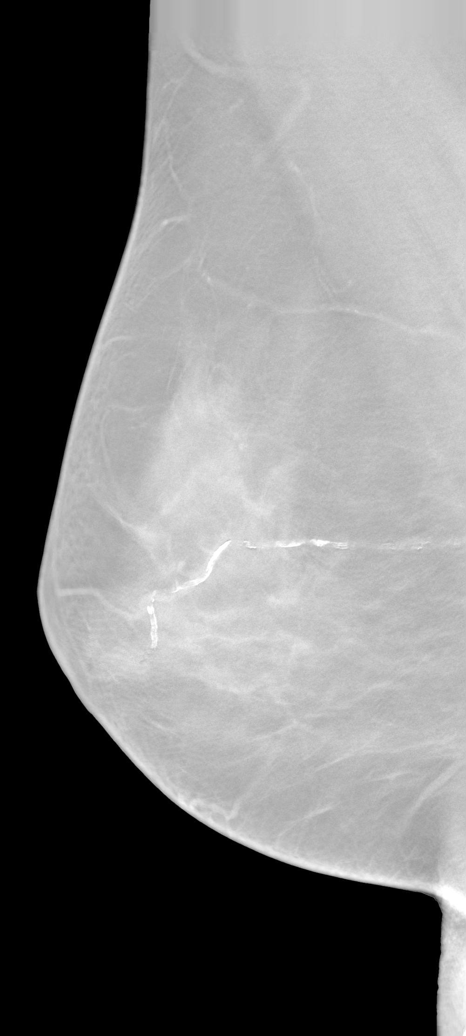
[im 2/2]
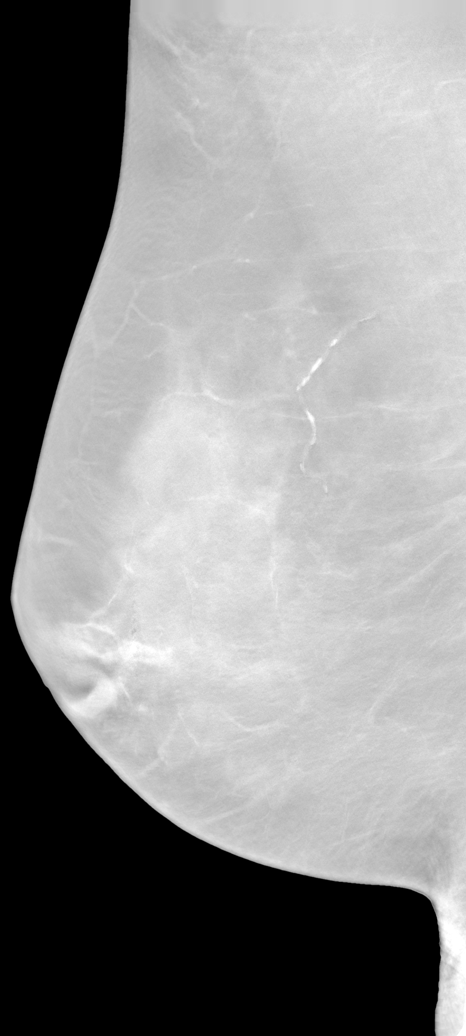
[im 2/2]
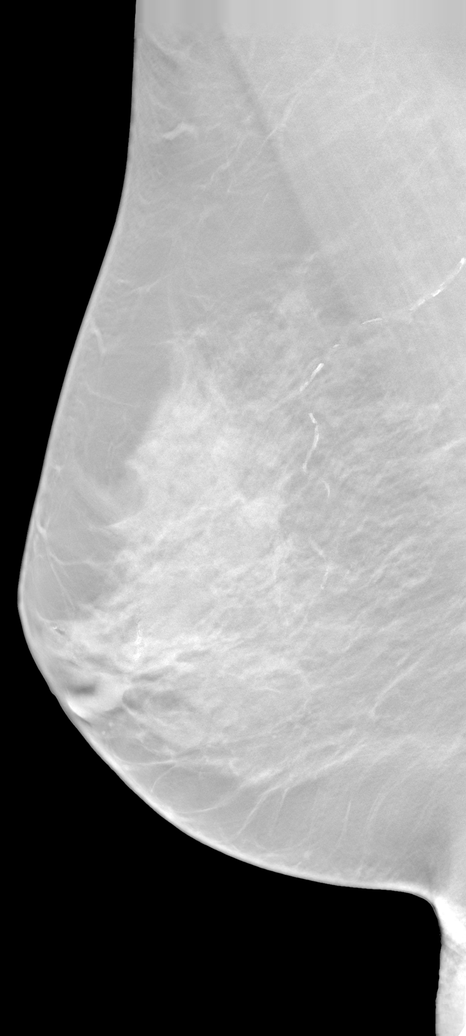

[3D SCREENING MAMMO BIL W/CAD & TOMO tomo (2 of 2) · tomo slice 11/68.0]
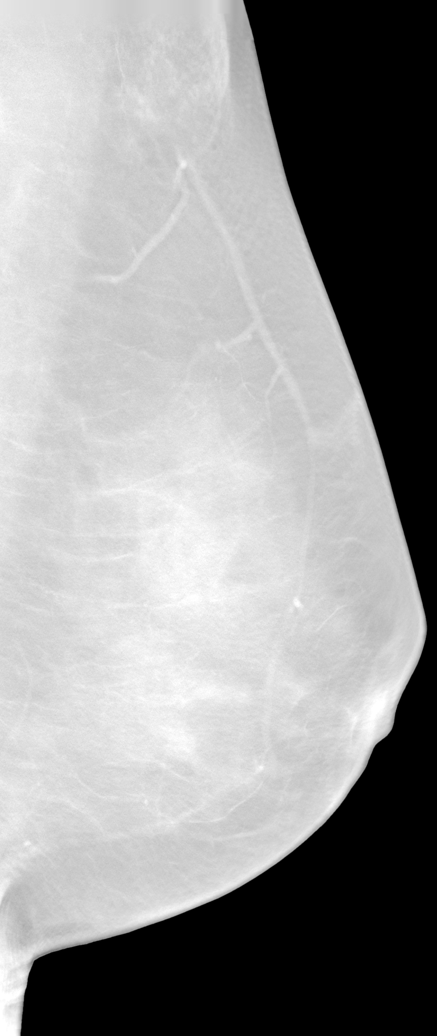

[8 of 24 positions shown; findings below may reference images not displayed]

FINDINGS: Breast parenchyma is heterogeneously dense.  There is no mass or suspicious cluster of microcalcifications.  There is no architectural distortion, skin thickening or nipple retraction.
IMPRESSION: 1.  BIRADS 2-Benign findings. Patient has been added in a reminder system with a target date for the next screening mammography.

2.  DENSITY CODE – C (Heterogeneously dense). 

Final Assessment Code:

Bi-Rads 2 

BI-RADS 0
 Need additional imaging evaluation.

BI-RADS 1
 Negative mammogram.

BI-RADS 2
 Benign finding.

BI-RADS 3
 Probably benign finding; short-interval follow-up suggested.

BI-RADS 4
 Suspicious abnormality; biopsy should be considered.

BI-RADS 5
 Highly suggestive of malignancy; appropriate action should be taken.

BI-RADS 6
 Known biopsy-proven malignancy; appropriate action should be taken.

NOTE:
In compliance with Federal regulations, the results of this mammogram are being sent to the patient.

------------- REPORT [id number] -------------
[facility] [place]
[address]
Tiger Ms.NESIGILINK, AIVARUNCE:
We wish to report the following on your recent mammography examination. We are sending a report to your referring physician or other health care provider. 
(       Normal/Negative:
No evidence of cancer.
This statement is mandated by the [place], Department of Health.
Your examination was performed by one of our technologists, who are registered radiological technologists and also specially certified in mammography:
___
Markland, Marjuan (M)

Your mammogram was interpreted by our radiologist.

( 
Collette Sedman, M.D.

(Annual Breast Examination by a physician or other health care provider
(Annual Mammography Screening beginning at age 40
(Monthly Breast Self Examination

## 2021-01-14 IMAGING — MR MRI SHOULDER LT W/O CONTRAST
5 of 6 series · 30 of 40 positions shown · IV contrast (gadolinium)
Comparison: None available.

﻿EXAM:  [id number]   MRI SHOULDER LT W/O CONTRAST
INDICATION: Persistent pain and decreased range of motion. History of prior fracture.
TECHNIQUE: Multiplanar multisequential MRI of the left shoulder joint was performed without gadolinium contrast.

[Series 6: T1 · oblique · left · 4.0mm · 0.31mm/px · 6 of 22 slices shown]
[im 1/22]
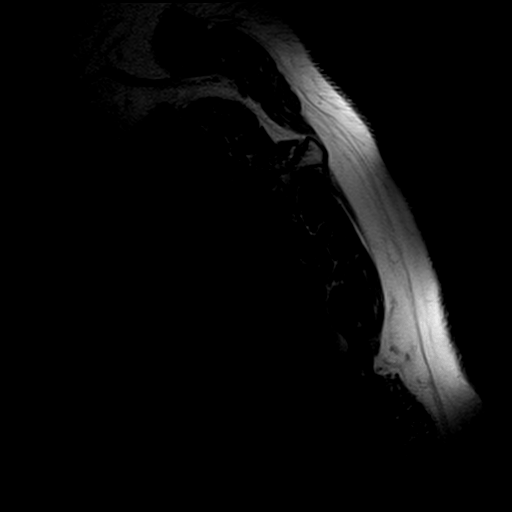
[im 5/22]
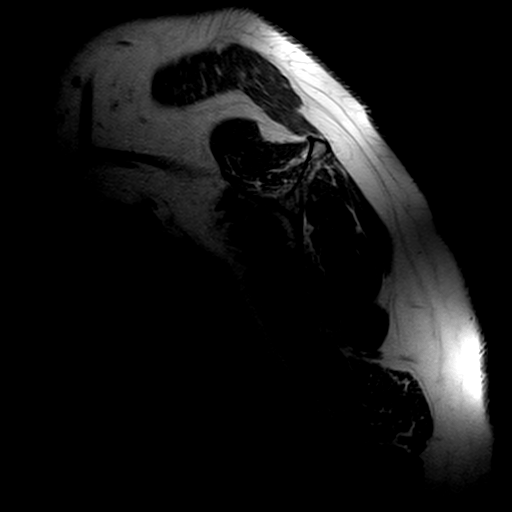
[im 9/22]
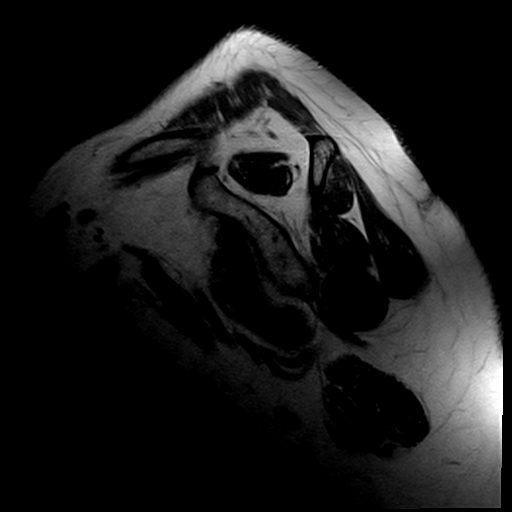
[im 13/22]
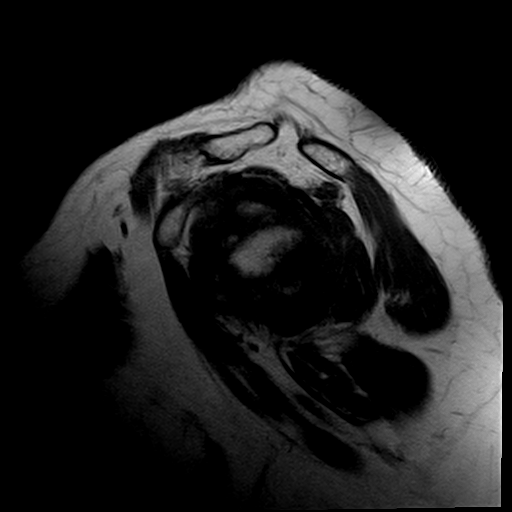
[im 17/22]
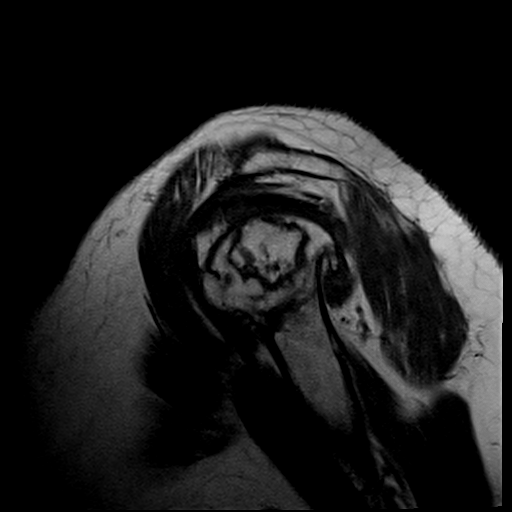
[im 22/22]
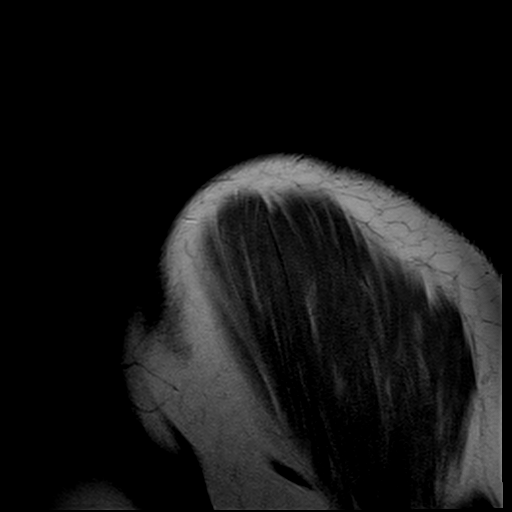

[Series 7: PD fat-sat · axial · left · 4.0mm · 0.36mm/px · z∈[-90,+19]mm · 7 of 24 slices shown (1 of 2)]
[im 1/24]
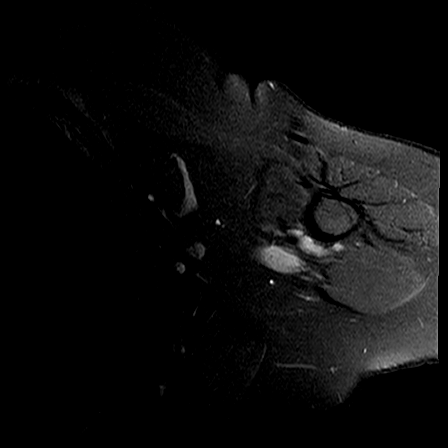
[im 4/24]
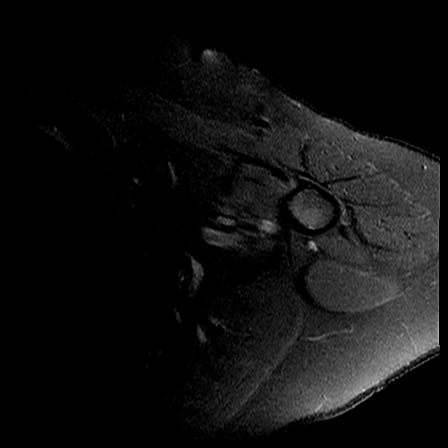
[im 8/24]
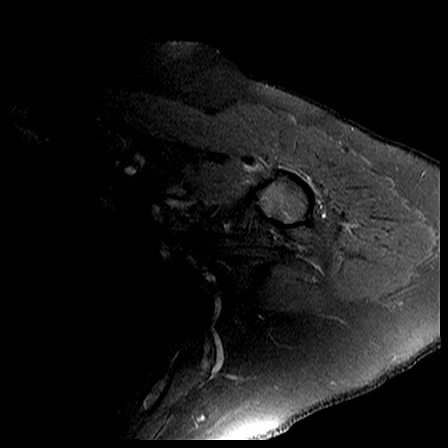
[im 12/24]
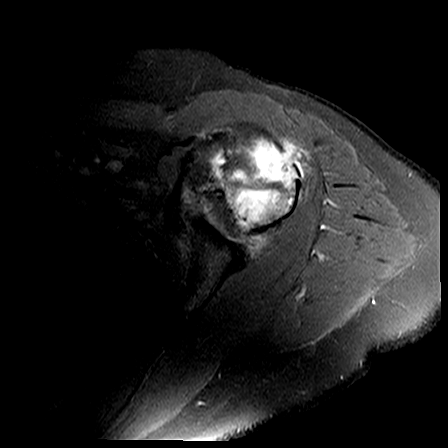
[im 16/24]
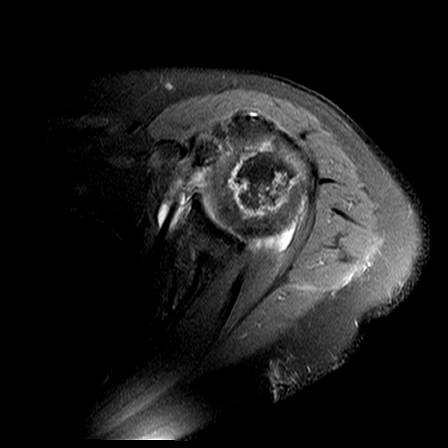
[im 20/24]
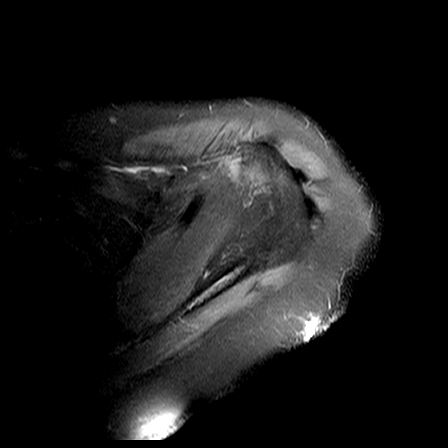
[im 24/24]
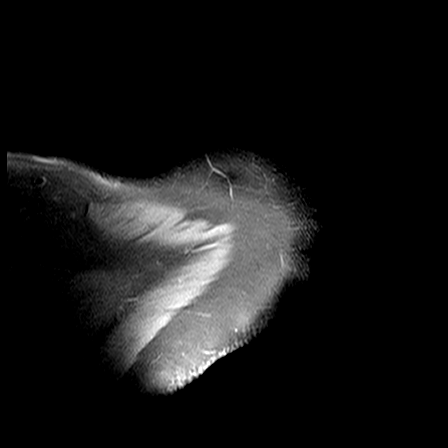

[Series 8: T2 fat-sat · axial · left · 4.0mm · 0.42mm/px · z∈[-90,+19]mm · 7 of 24 slices shown]
[im 1/24]
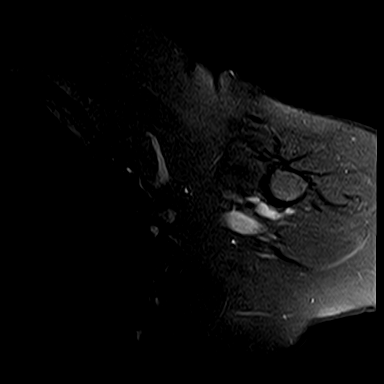
[im 4/24]
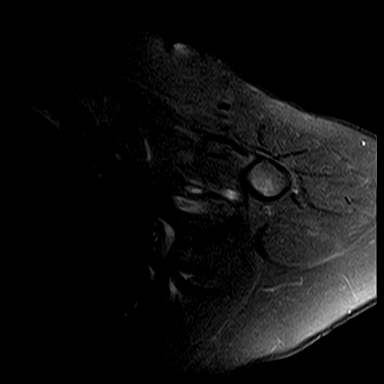
[im 8/24]
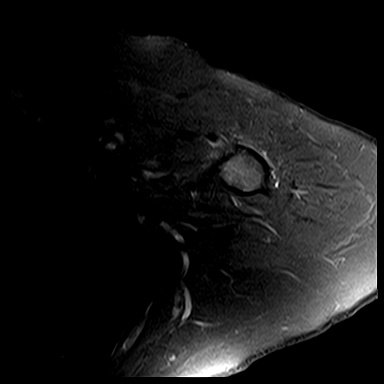
[im 12/24]
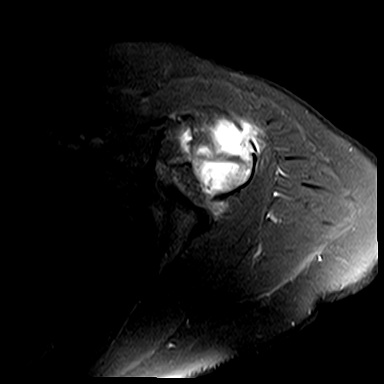
[im 16/24]
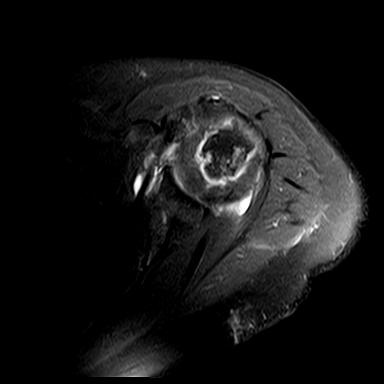
[im 20/24]
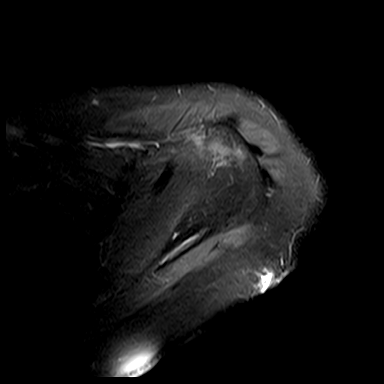
[im 24/24]
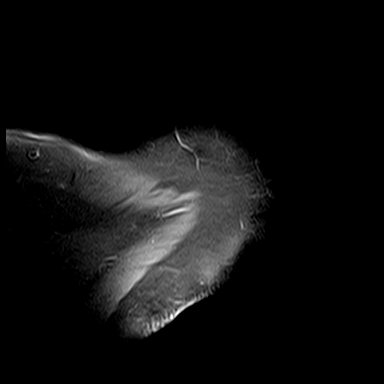

[Series 9: STIR · oblique · left · 4.0mm · 0.50mm/px · 3 of 22 slices shown]
[im 1/22]
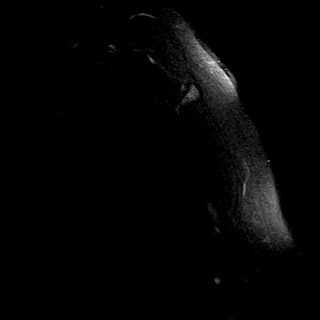
[im 5/22]
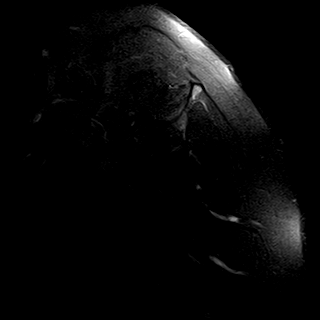
[im 9/22]
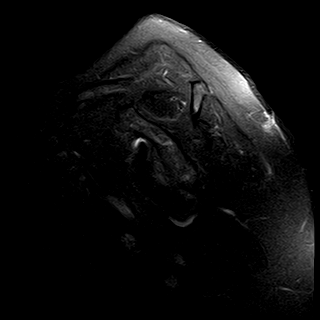

[Series 11: PD fat-sat · oblique · left · 3.5mm · 0.47mm/px · 7 of 23 slices shown (2 of 2)]
[im 1/23]
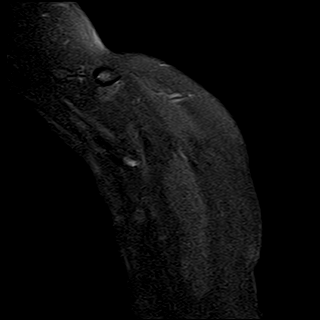
[im 4/23]
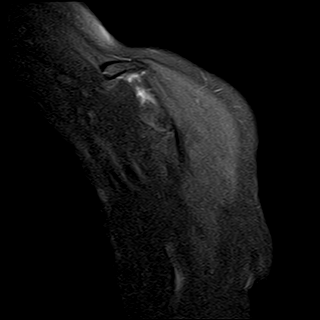
[im 8/23]
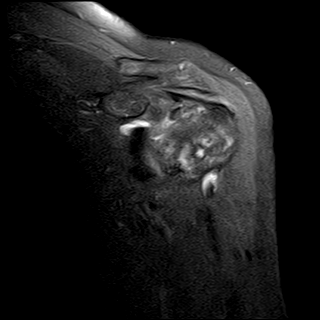
[im 12/23]
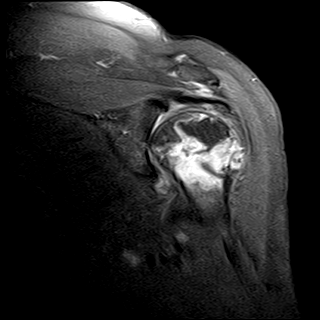
[im 15/23]
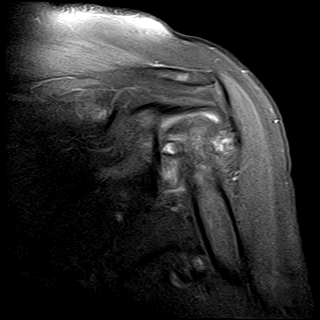
[im 19/23]
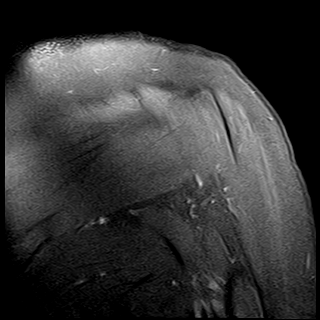
[im 23/23]
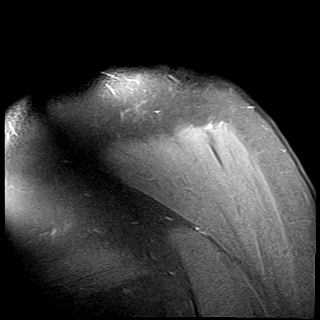

[30 of 40 positions shown; findings below may reference images not displayed]

FINDINGS: There is a comminuted nondisplaced subacute humeral head and neck fracture. There is near complete glenohumeral articular cartilage loss. Supraspinatus, infraspinatus, teres minor and subscapularis muscles and tendons are intact, within normal limits in morphology and signal intensity. Long head of biceps tendon is well seated within the intertubercular groove and attaches normally to the biceps anchor. Superior labrum appears intact. There is mild acromioclavicular joint osteoarthritis. There is no significant fluid within the subacromial/subdeltoid bursa. Muscle bulk and bone marrow signal intensity are normal. No mass is seen along the course of the suprascapular nerve, within the spinoglenoid notch or within the quadrilateral space.
IMPRESSION: 1. Comminuted nondisplaced subacute humeral head and neck fracture. 

2. Intact rotator cuff and superior labrum. 

3. Near complete glenohumeral articular cartilage loss. 

4. Mild acromioclavicular joint osteoarthritis.

## 2021-01-30 IMAGING — MR MRI CERVICAL SPINE WITHOUT CONTRAST
4 of 5 series · 24 of 48 positions shown · IV contrast (gadolinium)
Comparison: Cervical spine radiographs dated 01/26/2015.

﻿EXAM:  [id number]   MRI CERVICAL SPINE WITHOUT CONTRAST
INDICATION: Neck pain with left upper extremity radiculopathy.
TECHNIQUE: Multiplanar multisequential MRI of the cervical spine was performed without gadolinium contrast.

[Series 5: T2 · sagittal · 3.0mm · 0.62mm/px · 8 of 15 slices shown (1 of 2)]
[im 1/15]
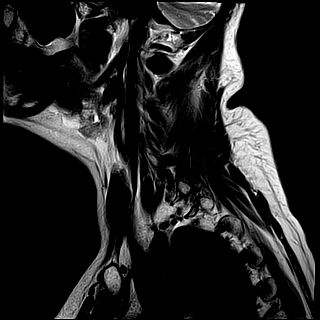
[im 2/15]
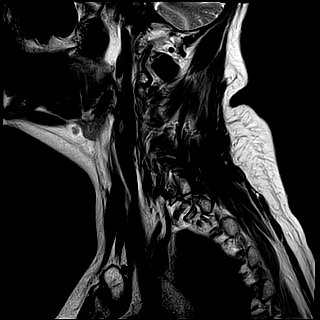
[im 5/15]
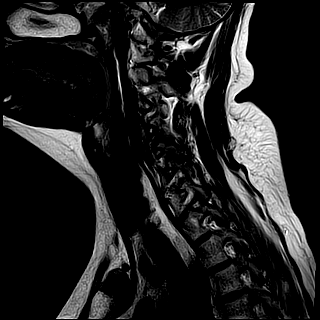
[im 7/15]
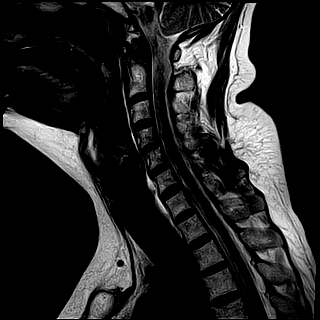
[im 8/15]
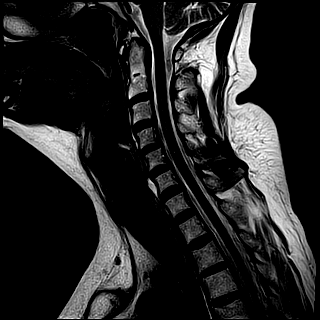
[im 10/15]
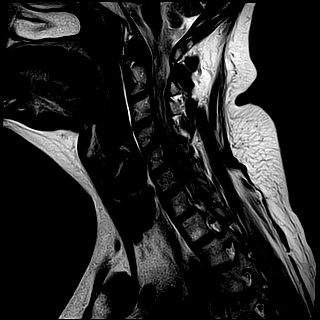
[im 13/15]
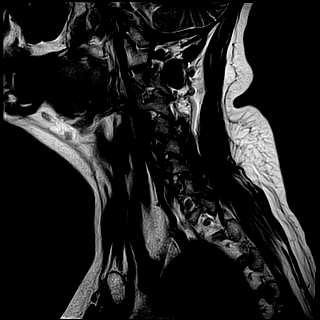
[im 15/15]
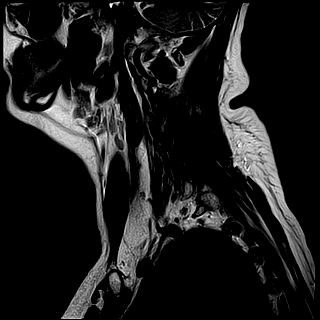

[Series 6: T1 · sagittal · 3.0mm · 0.47mm/px · 5 of 15 slices shown]
[im 1/15]
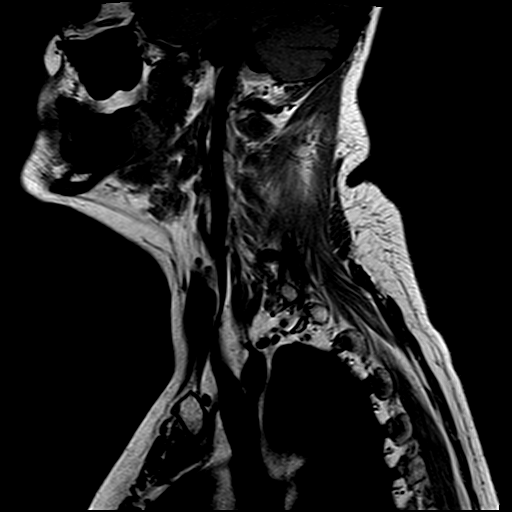
[im 2/15]
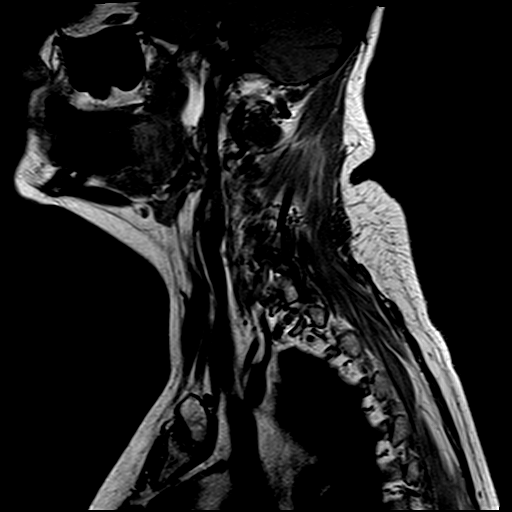
[im 4/15]
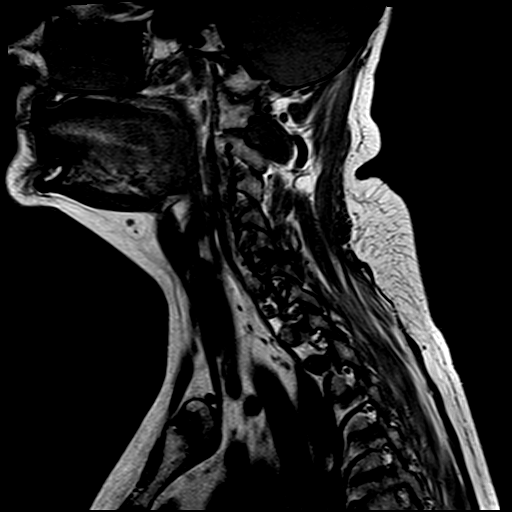
[im 8/15]
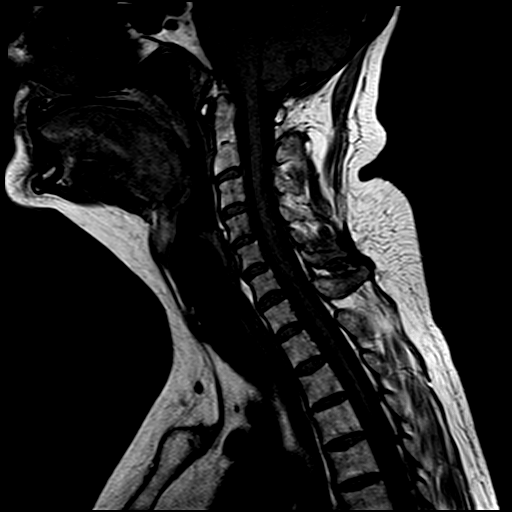
[im 13/15]
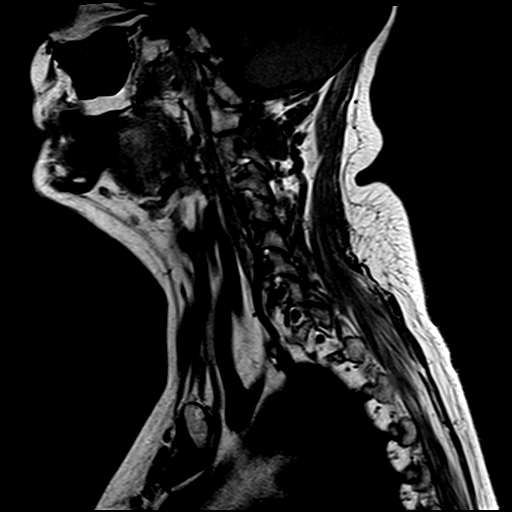

[Series 7: STIR · sagittal · 3.0mm · 0.39mm/px · 3 of 15 slices shown]
[im 2/15]
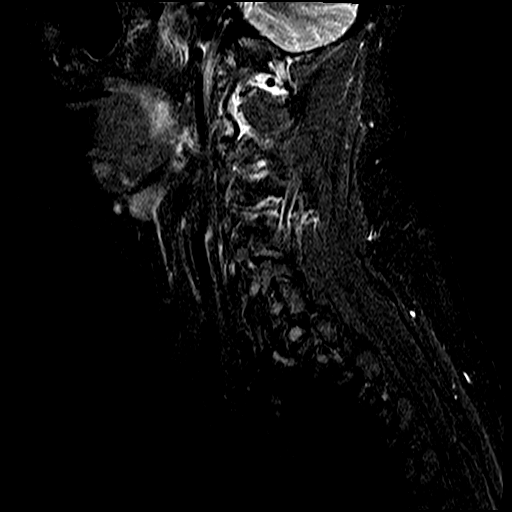
[im 8/15]
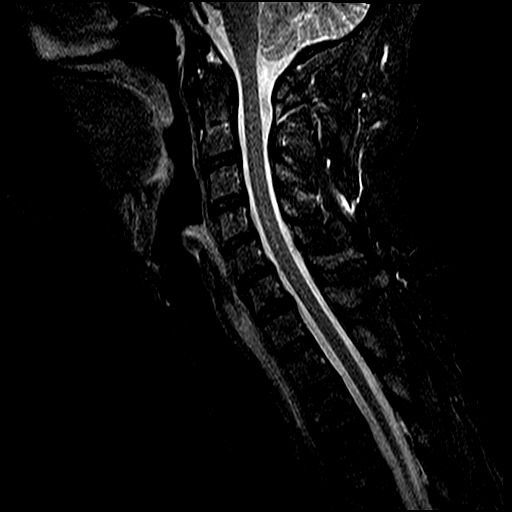
[im 13/15]
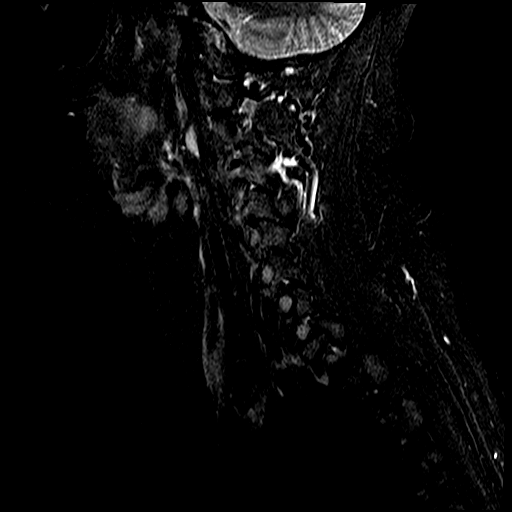

[Series 8: T2 · axial · 3.0mm · 0.39mm/px · z∈[-64,+36]mm · 8 of 17 slices shown (2 of 2)]
[im 1/17]
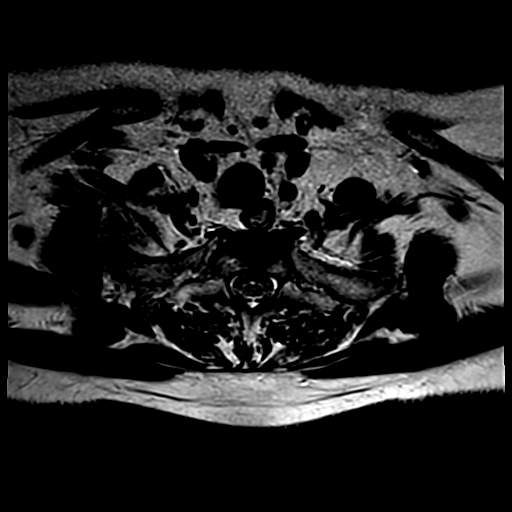
[im 2/17]
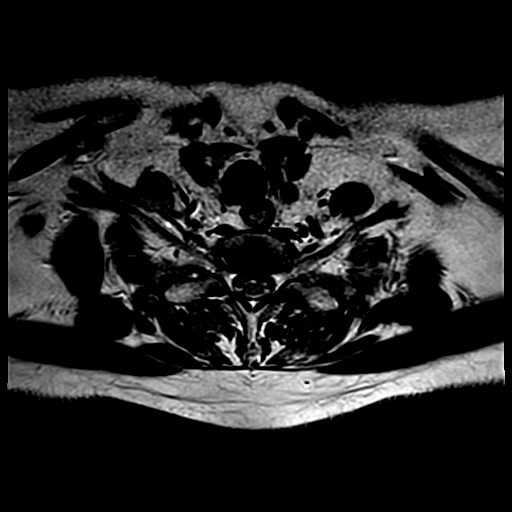
[im 6/17]
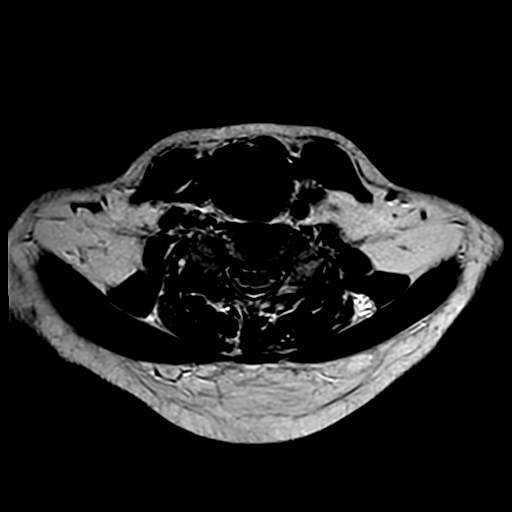
[im 8/17]
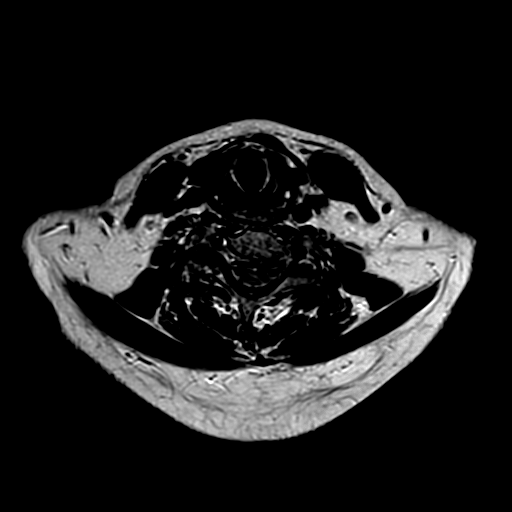
[im 9/17]
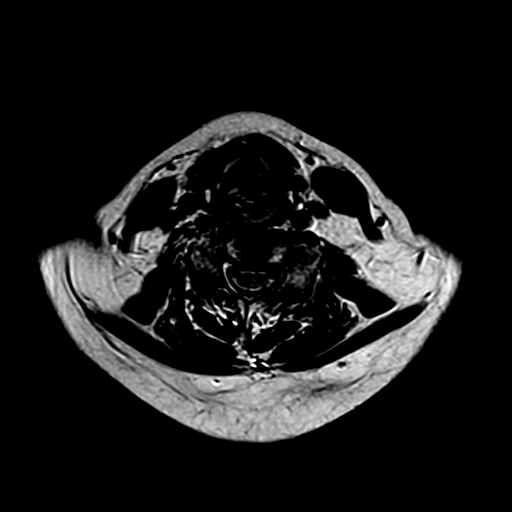
[im 11/17]
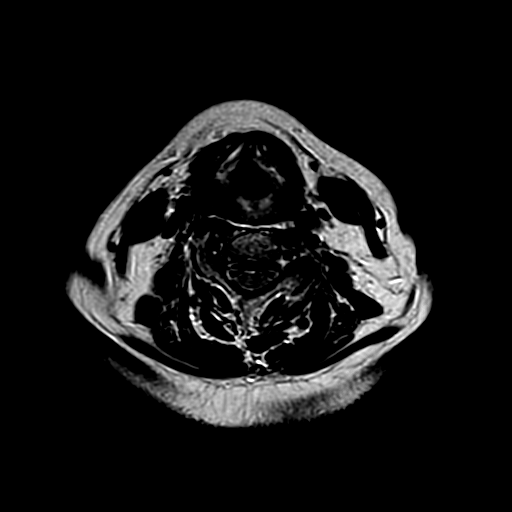
[im 15/17]
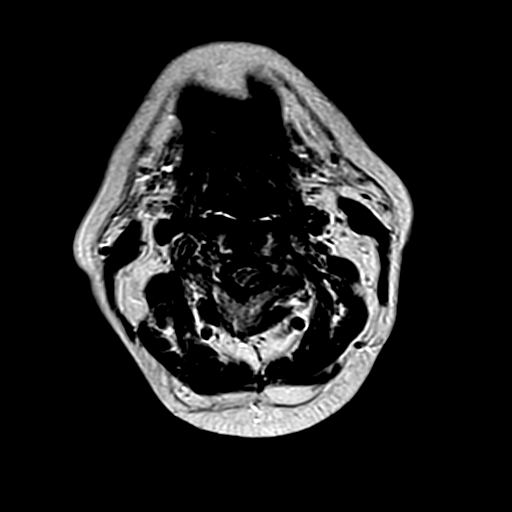
[im 17/17]
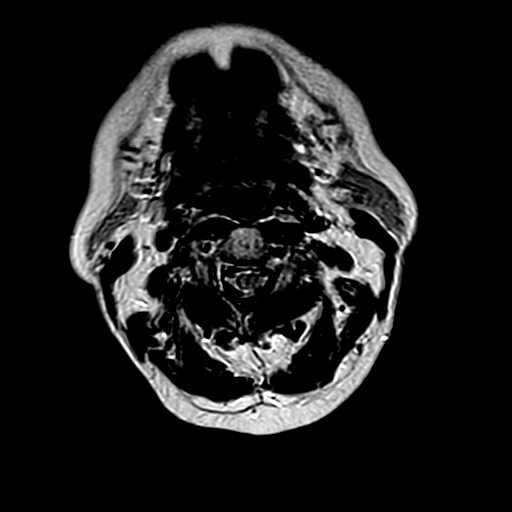

[24 of 48 positions shown; findings below may reference images not displayed]

FINDINGS: Vertebral bodies are normal in height, alignment and signal intensity. There is no acute fracture or subluxation. Visualized spinal cord is normal in signal intensity without evidence of compression at any level. 

C2-3 level is unremarkable.

At C3-4 level, there is a minimal bulging annulus, minimally effacing the ventral CSF. There is no significant neural foraminal stenosis.

At C4-5 level, there is severe right neural foraminal stenosis from facet and uncovertebral joint hypertrophy.

At C5-6 level, there is a small broad-based central disc bulge, mildly effacing the ventral CSF. There is mild left neural foraminal stenosis from uncovertebral joint hypertrophy.

At C6-7 level, there is a small broad-based central disc bulge, mildly effacing the ventral CSF. There is no significant neural foraminal stenosis.

C7-T1 level and paraspinal soft tissues are unremarkable.
IMPRESSION: 1. No significant disc herniation or spinal stenosis at any level. 

2. Multilevel neural foraminal stenosis as detailed above.

## 2021-07-23 ENCOUNTER — Encounter (HOSPITAL_COMMUNITY): Payer: Self-pay

## 2021-07-23 ENCOUNTER — Emergency Department
Admission: EM | Admit: 2021-07-23 | Discharge: 2021-07-23 | Disposition: A | Discharge status: Home / Self Care | Payer: BC Managed Care – PPO | Attending: PHYSICIAN ASSISTANT | Admitting: PHYSICIAN ASSISTANT

## 2021-07-23 ENCOUNTER — Other Ambulatory Visit: Payer: Self-pay

## 2021-07-23 DIAGNOSIS — R197 Diarrhea, unspecified: Secondary | ICD-10-CM

## 2021-07-23 DIAGNOSIS — N183 Chronic kidney disease, stage 3 unspecified: Secondary | ICD-10-CM | POA: Insufficient documentation

## 2021-07-23 DIAGNOSIS — R7982 Elevated C-reactive protein (CRP): Secondary | ICD-10-CM | POA: Insufficient documentation

## 2021-07-23 HISTORY — DX: Arthropathy, unspecified: M12.9

## 2021-07-23 HISTORY — DX: Type 2 diabetes mellitus without complications (CMS HCC): E11.9

## 2021-07-23 HISTORY — DX: Chronic kidney disease, unspecified: N18.9

## 2021-07-23 HISTORY — DX: Essential (primary) hypertension: I10

## 2021-07-23 LAB — MANUAL DIFFERENTIAL
BAND %: 14 % — ABNORMAL HIGH (ref 5–11)
BANDS NEUTROPHILS MANUAL: 14
BASOPHIL %: 2 % (ref 0–3)
BASOPHIL ABSOLUTE: 0.1 10*3/uL (ref 0.00–0.30)
BASOPHILS MANUAL: 2
LYMPHOCYTE %: 30 % (ref 25–45)
LYMPHOCYTE ABSOLUTE: 1.44 10*3/uL (ref 1.10–5.00)
LYMPHOCYTES MANUAL: 30
MONOCYTE %: 12 % (ref 0–12)
MONOCYTE ABSOLUTE: 0.58 10*3/uL (ref 0.00–1.30)
MONOCYTES MANUAL: 12
NEUTROPHIL %: 42 % (ref 40–76)
NEUTROPHIL ABSOLUTE: 2.69 10*3/uL (ref 1.80–8.40)
NEUTROPHILS MANUAL: 42
PLATELET MORPHOLOGY COMMENT: NORMAL
RBC MORPHOLOGY COMMENT: NORMAL
TOTAL CELLS COUNTED [#] IN BLOOD: 100
WBC: 4.8 10*3/uL

## 2021-07-23 LAB — COMPREHENSIVE METABOLIC PANEL, NON-FASTING
ALBUMIN/GLOBULIN RATIO: 1.5 — ABNORMAL HIGH (ref 0.8–1.4)
ALBUMIN: 4.2 g/dL (ref 3.5–5.7)
ALKALINE PHOSPHATASE: 78 U/L (ref 34–104)
ALT (SGPT): 19 U/L (ref 7–52)
ANION GAP: 6 mmol/L — ABNORMAL LOW (ref 10–20)
AST (SGOT): 22 U/L (ref 13–39)
BILIRUBIN TOTAL: 0.7 mg/dL (ref 0.3–1.2)
BUN/CREA RATIO: 20 (ref 6–22)
BUN: 42 mg/dL — ABNORMAL HIGH (ref 7–25)
CALCIUM, CORRECTED: 8.8 mg/dL — ABNORMAL LOW (ref 8.9–10.8)
CALCIUM: 9 mg/dL (ref 8.6–10.3)
CHLORIDE: 106 mmol/L (ref 98–107)
CO2 TOTAL: 20 mmol/L — ABNORMAL LOW (ref 21–31)
CREATININE: 2.15 mg/dL — ABNORMAL HIGH (ref 0.60–1.30)
ESTIMATED GFR: 26 mL/min/{1.73_m2} — ABNORMAL LOW (ref >59)
GLOBULIN: 2.8 — ABNORMAL LOW (ref 2.9–5.4)
GLUCOSE: 95 mg/dL (ref 74–109)
OSMOLALITY, CALCULATED: 275 mOsm/kg (ref 270–290)
POTASSIUM: 4.6 mmol/L (ref 3.5–5.1)
PROTEIN TOTAL: 7 g/dL (ref 6.4–8.9)
SODIUM: 132 mmol/L — ABNORMAL LOW (ref 136–145)

## 2021-07-23 LAB — URINALYSIS, MACROSCOPIC
BILIRUBIN: NEGATIVE mg/dL
BLOOD: NEGATIVE mg/dL
GLUCOSE: NEGATIVE mg/dL
KETONES: NEGATIVE mg/dL
LEUKOCYTES: NEGATIVE WBCs/uL
NITRITE: NEGATIVE
PH: 5 (ref 5.0–9.0)
PROTEIN: NEGATIVE mg/dL
SPECIFIC GRAVITY: 1.015 (ref 1.002–1.030)
UROBILINOGEN: NORMAL mg/dL

## 2021-07-23 LAB — BLUE TOP TUBE

## 2021-07-23 LAB — CBC WITH DIFF
HCT: 33.9 % — ABNORMAL LOW (ref 37.0–47.0)
HGB: 11.8 g/dL — ABNORMAL LOW (ref 12.5–16.0)
MCH: 33.2 pg — ABNORMAL HIGH (ref 27.0–32.0)
MCHC: 34.8 g/dL (ref 32.0–36.0)
MCV: 95.4 fL (ref 78.0–99.0)
MPV: 7.3 fL — ABNORMAL LOW (ref 7.4–10.4)
PLATELETS: 216 10*3/uL (ref 140–440)
RBC: 3.55 10*6/uL — ABNORMAL LOW (ref 4.20–5.40)
RDW: 14.8 % (ref 11.6–14.8)
WBC: 4.8 10*3/uL (ref 4.0–10.5)
WBCS UNCORRECTED: 4.8 10*3/uL

## 2021-07-23 LAB — LACTIC ACID LEVEL W/ REFLEX FOR LEVEL >2.0: LACTIC ACID: 1 mmol/L (ref 0.5–2.2)

## 2021-07-23 LAB — URINALYSIS, MICROSCOPIC
HYALINE CASTS: 6 /lpf — ABNORMAL HIGH (ref <0)
SQUAMOUS EPITHELIAL: 1 /hpf (ref <28)

## 2021-07-23 LAB — LAVENDER TOP TUBE

## 2021-07-23 LAB — MAGNESIUM: MAGNESIUM: 2.1 mg/dL (ref 1.9–2.7)

## 2021-07-23 LAB — C-REACTIVE PROTEIN(CRP),INFLAMMATION: C-REACTIVE PROTEIN (CRP): 5 mg/dL — ABNORMAL HIGH (ref 0.1–0.5)

## 2021-07-23 LAB — GOLD TOP TUBE

## 2021-07-23 LAB — LIPASE: LIPASE: 29 U/L (ref 11–82)

## 2021-07-23 LAB — LIGHT GREEN TOP TUBE

## 2021-07-23 MED ORDER — SODIUM CHLORIDE 0.9 % (FLUSH) INJECTION SYRINGE
3.0000 mL | INJECTION | INTRAMUSCULAR | Status: DC | PRN
Start: 2021-07-23 — End: 2021-07-23

## 2021-07-23 MED ORDER — SODIUM CHLORIDE 0.9 % (FLUSH) INJECTION SYRINGE
3.0000 mL | INJECTION | Freq: Three times a day (TID) | INTRAMUSCULAR | Status: DC
Start: 2021-07-23 — End: 2021-07-23
  Administered 2021-07-23: 3 mL

## 2021-07-23 MED ORDER — ONDANSETRON 4 MG DISINTEGRATING TABLET
4.0000 mg | ORAL_TABLET | Freq: Three times a day (TID) | ORAL | 0 refills | Status: AC | PRN
Start: 2021-07-23 — End: ?

## 2021-07-23 MED ORDER — SODIUM CHLORIDE 0.9 % IV BOLUS
1000.0000 mL | INJECTION | Status: AC
Start: 2021-07-23 — End: 2021-07-23
  Administered 2021-07-23: 1000 mL via INTRAVENOUS
  Administered 2021-07-23: 0 mL via INTRAVENOUS

## 2021-07-23 NOTE — ED Provider Notes (Signed)
[facility]  ED Primary Provider Note  History of Present Illness   Chief Complaint   Patient presents with   . Diarrhea   . Weakness     Sylvia Davis is a 61 y.o. female who had concerns including Diarrhea and Weakness.  Arrival: The patient arrived by Car    61 year old female presents ambulatory to the ED complaining of weakness fatigue and diarrhea since Friday.  Patient states she when out to eat Thursday afternoon and had baked potato soup and a pot roast same which.  Her husband had baked potato soup as well but he did not eat part Rocephin which.  She states the next day she started developing abdominal cramping diarrhea.  She has had loose to watery stools since then.  She has been feeling weak and fatigued as well.  She has had decreased appetite as well.  She has had some nausea but no vomiting.  She denies any fever chills, chest pain, shortness a breath.  She does have history of chronic kidney D disease stage III.  She does follow with a nephrologist in [place].        Review of Systems   Pertinent positive and negative ROS as per HPI.  Historical Data   History Reviewed This Encounter: Medical History  Surgical History  Family History  Social History      Physical Exam   ED Triage Vitals [07/23/21 1304]   BP (Non-Invasive) 111/61   Heart Rate 85   Respiratory Rate 19   Temperature 36.6 C (97.8 F)   SpO2 96 %   Weight 62.6 kg (138 lb)   Height 1.651 m (_0 )     Physical Exam  Vitals reviewed.   Constitutional:       General: She is not in acute distress.     Appearance: She is well-developed.   HENT:      Head: Normocephalic and atraumatic.   Eyes:      Conjunctiva/sclera: Conjunctivae normal.   Cardiovascular:      Rate and Rhythm: Normal rate and regular rhythm.      Heart sounds: No murmur heard.  Pulmonary:      Effort: Pulmonary effort is normal. No respiratory distress.      Breath sounds: Normal breath sounds.   Abdominal:      Palpations: Abdomen is  soft.      Tenderness: There is no abdominal tenderness.   Musculoskeletal:         General: No swelling.      Cervical back: Neck supple.   Skin:     General: Skin is warm and dry.      Capillary Refill: Capillary refill takes less than 2 seconds.   Neurological:      Mental Status: She is alert.   Psychiatric:         Mood and Affect: Mood normal.       Patient Data     Labs Ordered/Reviewed   C-REACTIVE PROTEIN(CRP),INFLAMMATION - Abnormal; Notable for the following components:       Result Value    C-REACTIVE PROTEIN (CRP) 5.0 (*)     All other components within normal limits   COMPREHENSIVE METABOLIC PANEL, NON-FASTING - Abnormal; Notable for the following components:    SODIUM 132 (*)     CO2 TOTAL 20 (*)     ANION GAP 6 (*)     BUN 42 (*)     CREATININE 2.15 (*)  ESTIMATED GFR 26 (*)     ALBUMIN/GLOBULIN RATIO 1.5 (*)     CALCIUM, CORRECTED 8.8 (*)     GLOBULIN 2.8 (*)     All other components within normal limits    Narrative:     Estimated Glomerular Filtration Rate (eGFR) is calculated using the CKD-EPI (2021) equation, intended for patients 89 years of age and older. If gender is not documented or "unknown", there will be no eGFR calculation.   CBC WITH DIFF - Abnormal; Notable for the following components:    RBC 3.55 (*)     HGB 11.8 (*)     HCT 33.9 (*)     MCH 33.2 (*)     MPV 7.3 (*)     All other components within normal limits   URINALYSIS, MICROSCOPIC - Abnormal; Notable for the following components:    HYALINE CASTS 6 (*)     All other components within normal limits   MANUAL DIFFERENTIAL - Abnormal; Notable for the following components:    BAND % 14 (*)     All other components within normal limits   LACTIC ACID LEVEL W/ REFLEX FOR LEVEL >2.0 - Normal   MAGNESIUM - Normal   LIPASE - Normal   URINALYSIS, MACROSCOPIC - Normal   CBC/DIFF    Narrative:     The following orders were created for panel order CBC/DIFF.  Procedure                               Abnormality         Status                      ---------                               -----------         ------                     CBC WITH DIFF[[id number]]                Abnormal            Final result               MANUAL DIFFERENTIAL[[id number]]          Abnormal            Final result                 Please view results for these tests on the individual orders.   URINALYSIS, MACROSCOPIC AND MICROSCOPIC W/CULTURE REFLEX    Narrative:     The following orders were created for panel order URINALYSIS, MACROSCOPIC AND MICROSCOPIC W/CULTURE REFLEX.  Procedure                               Abnormality         Status                     ---------                               -----------         ------  URINALYSIS, MACROSCOPIC[[id number]]      Normal              Final result               URINALYSIS, MICROSCOPIC[[id number]]      Abnormal            Final result                 Please view results for these tests on the individual orders.   BLUE TOP TUBE   EXTRA TUBES    Narrative:     The following orders were created for panel order EXTRA TUBES.  Procedure                               Abnormality         Status                     ---------                               -----------         ------                     BLUE TOP HCWC[[id number]]                                    Final result               GOLD TOP VOHY[[id number]]                                    In process                 LIGHT GREEN TOP TUBE[[id number]]                             In process                 LAVENDER TOP RSWN[[id number]]                                In process                   Please view results for these tests on the individual orders.   GOLD TOP TUBE   LIGHT GREEN TOP TUBE   LAVENDER TOP TUBE     No orders to display     Medical Decision Making        Medical Decision Making  Patient had presented for diarrhea weakness and fatigue over the last 4-5 days.  Symptoms started after eating a pot roast sandwich the night before.  Lab work as well as IV fluid  bolus was ordered.  Patient's lab work does show elevated CRP of 5.  Her creatinine is elevated 2 1.  Do not have a comparison but patient does have a known history CKD.  She was given 1 liter NS bolus. She does feel better after the fluids. She has not had any diarrhea while here.     Amount and/or Complexity of Data  Reviewed  Labs: ordered.      Risk  Prescription drug management.               Medications Administered in the ED   NS flush syringe (3 mL Intracatheter Given 07/23/21 1400)   NS flush syringe (has no administration in time range)   NS bolus infusion 1,000 mL (0 mL Intravenous Stopped 07/23/21 1506)     Clinical Impression   Diarrhea, unspecified type (Primary)   CKD (chronic kidney disease) stage 3, GFR 30-59 ml/min (CMS HCC)       Disposition: Discharged

## 2021-07-23 NOTE — ED Triage Notes (Signed)
States diarrhea since Friday with nausea. States she has only had one stool today. Denies fever or abd pain.

## 2021-07-23 NOTE — ED APP Handoff Note (Signed)
[facility]  Emergency Department  Provider in Triage Note    Name: Sylvia Davis  Age: 61 y.o.  Gender: female     Subjective:   Sylvia Davis is a 61 y.o. female who presents with complaint of Diarrhea and Weakness  . Patient here with c/o diarrhea since Friday. Also c/o generalized weakness and fatigue. Denies any recent antibiotic use. Denies any recent sick contacts.    Objective:   Filed Vitals:    07/23/21 1304   BP: 111/61   Pulse: 85   Resp: 19   Temp: 36.6 C (97.8 F)   SpO2: 96%       Assessment:  A medical screening exam was completed.  This patient is a 61 y.o. female with initial findings showing diarrhea    Plan:  Please see initial orders and work-up below.  This is to be continued with full evaluation in the main Emergency Department.     No current facility-administered medications for this encounter.     No results found for this or any previous visit (from the past 24 hour(s)).     Sherlie Ban, FNP-BC  07/23/2021, 13:02

## 2021-07-23 NOTE — Discharge Instructions (Signed)
Drink plenty of fluids. Bland diet then advance as tolerated. Continue any at home medications as previously prescribed. Take medications that are prescribed from today's visit as prescribed. Discuss any questions you may have concerning your medications with your pharmacist. Follow up with your regular PCP in the next 2-3 days. Return to the ED if symptoms worsen, change, or do not improve.

## 2021-07-23 NOTE — ED Nurses Note (Signed)
Patient discharged to home. A written copy of discharge instructions was given to the patient. Questions sufficiently answered as needed. Patient encouraged to follow up with PCP as indicated. IV removed, catheter intact. Pressure dressing applied and bleeding controlled. Patient left department via ambulation.

## 2021-07-23 NOTE — ED Nurses Note (Signed)
Patient to EDM via ambulation with complaints of diarrhea X5 days. Patient denies nausea, abdominal pain, or vomiting currently. Patient states last episode of diarrhea was 0630 this am. Assessment as documented. Call bell in reach.

## 2022-02-25 IMAGING — US US ABDOMEN COMPLETE
1 series · 14 of 25 positions shown · non-contrast
Comparison: Ultrasound exam dated 11/12/2017.

﻿EXAM: US ABDOMEN COMPLETE
INDICATION: History of liver disease. Chronic kidney disease. Hypertension.

[Series 1: us abdomen complete · 14 of 47 slices shown]
[im 1/47]
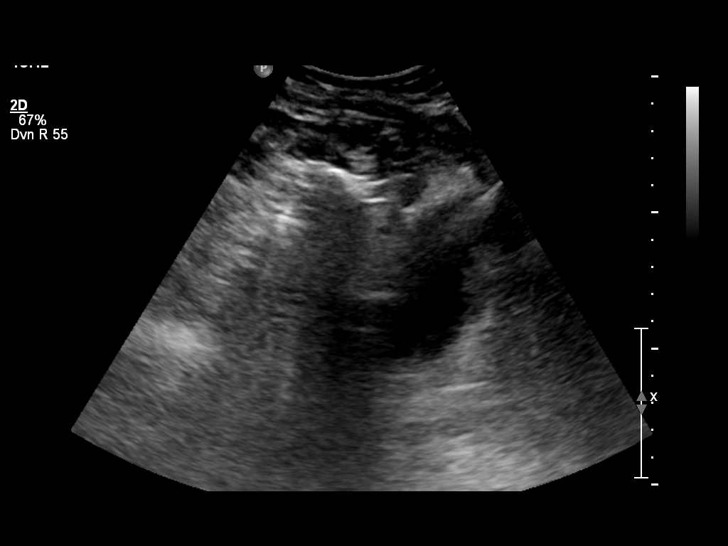
[im 4/47]
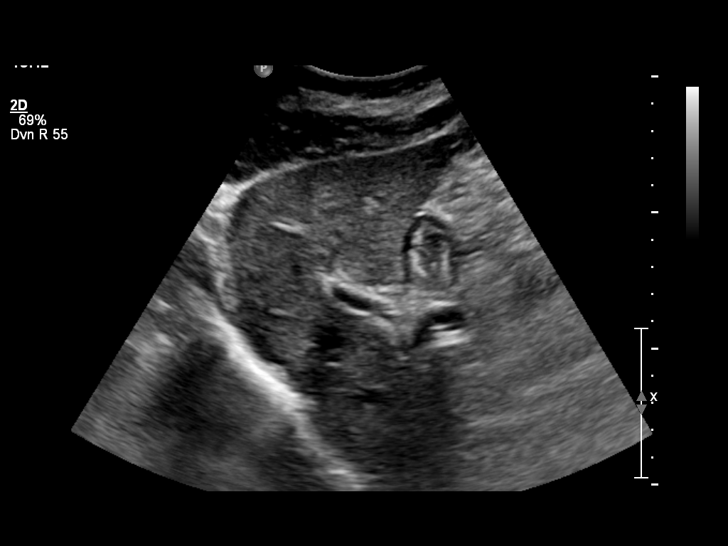
[im 8/47]
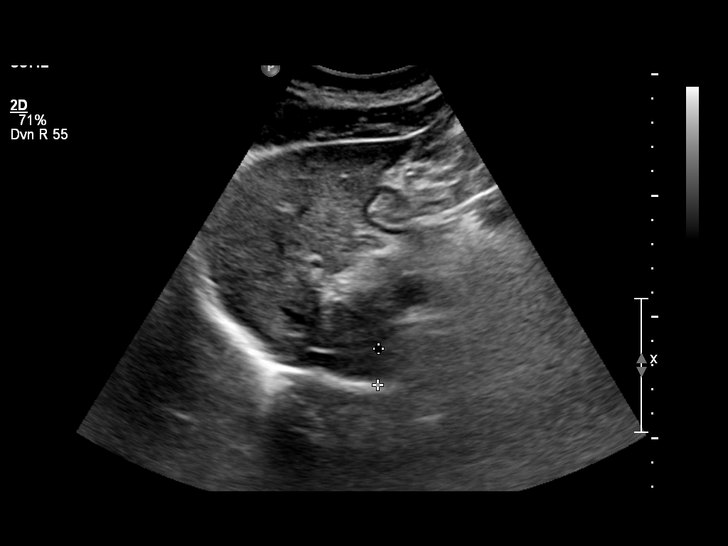
[im 12/47]
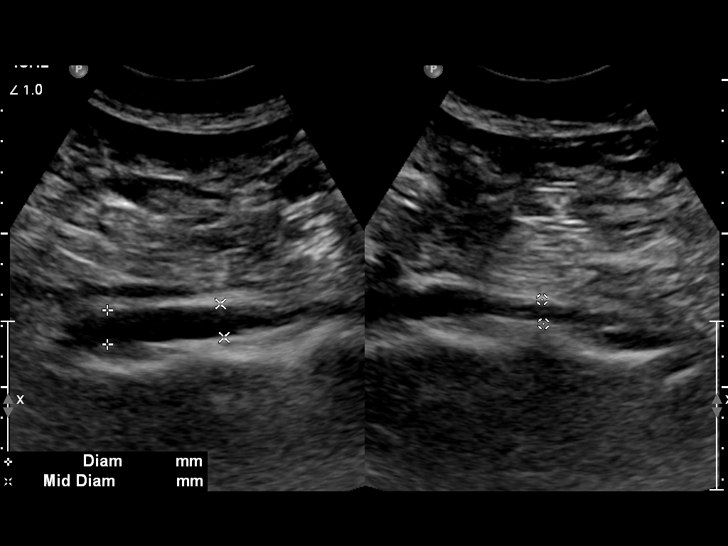
[im 16/47]
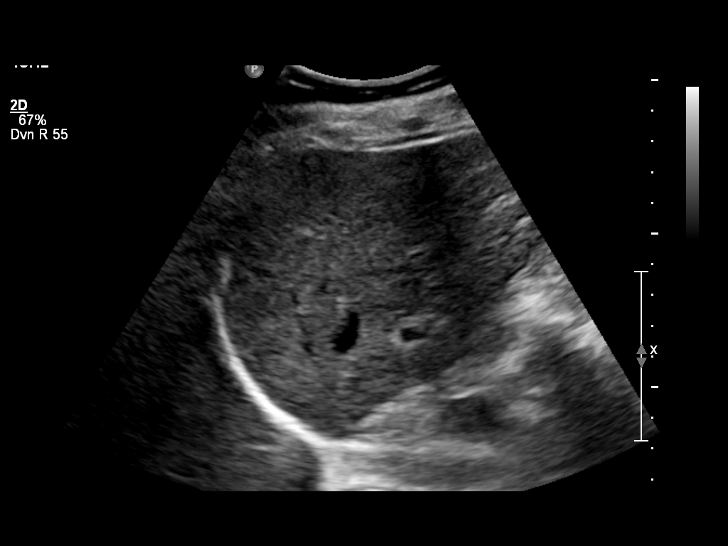
[im 18/47]
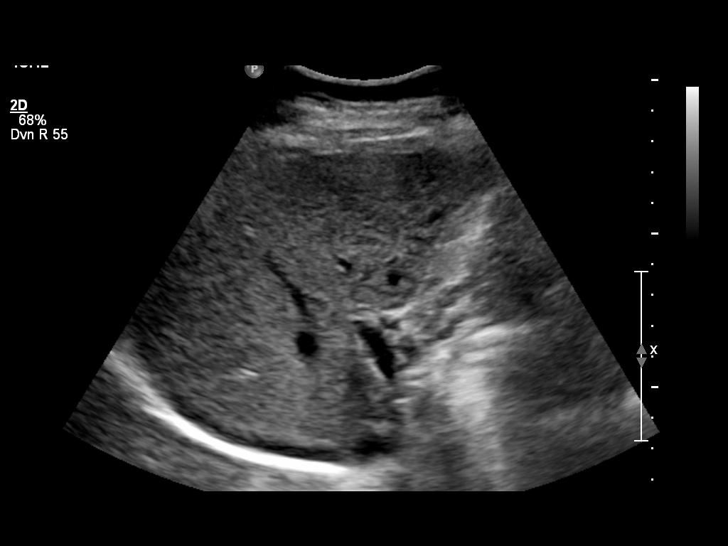
[im 22/47]
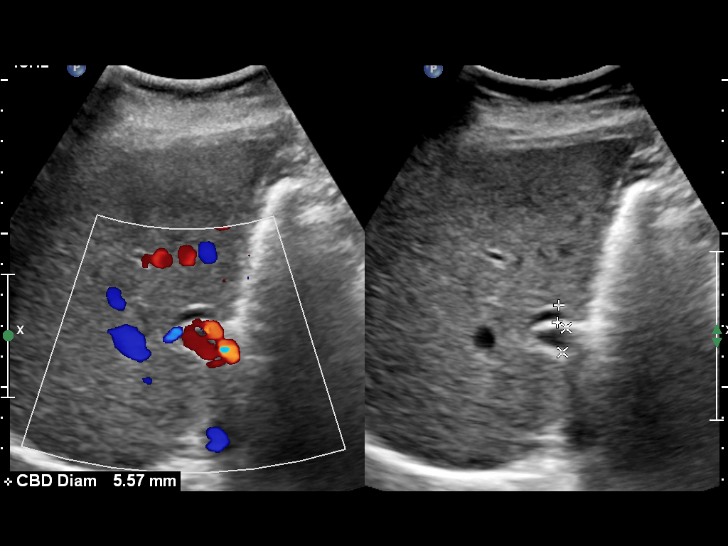
[im 25/47]
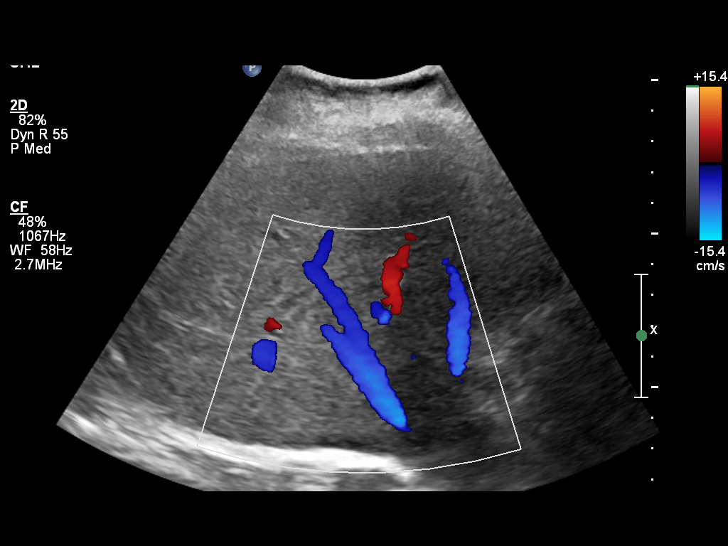
[im 29/47]
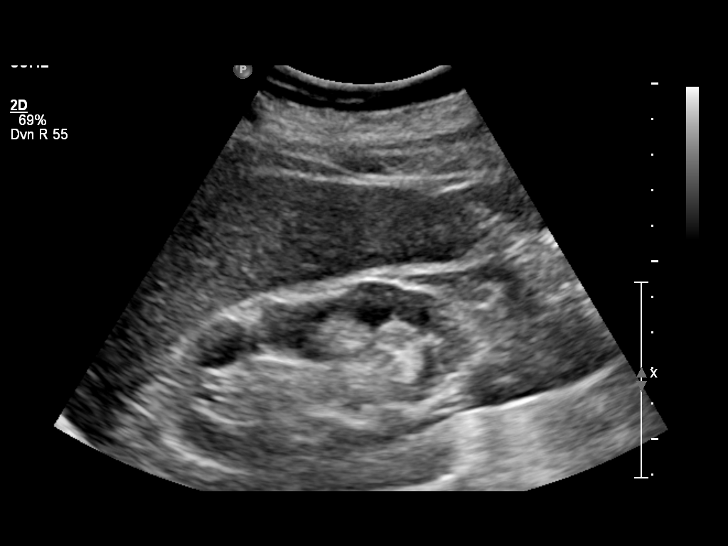
[im 31/47]
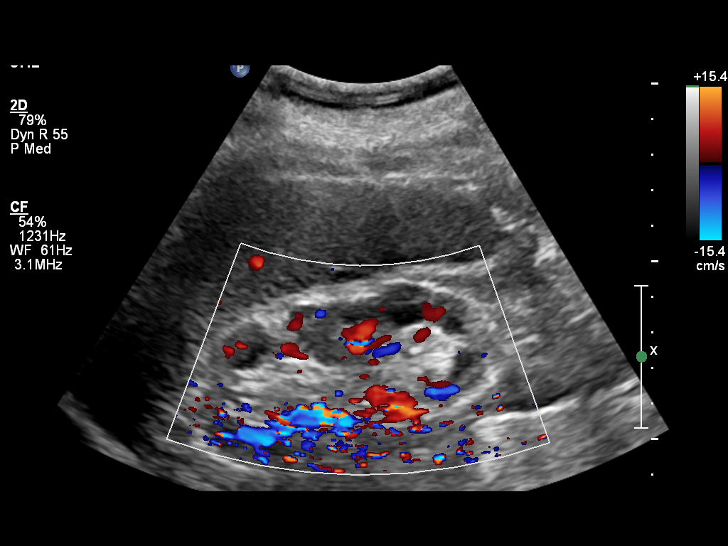
[im 35/47]
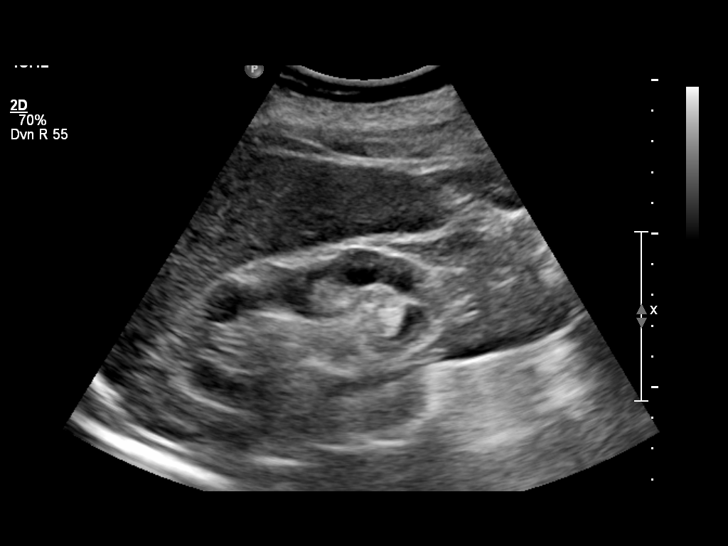
[im 39/47]
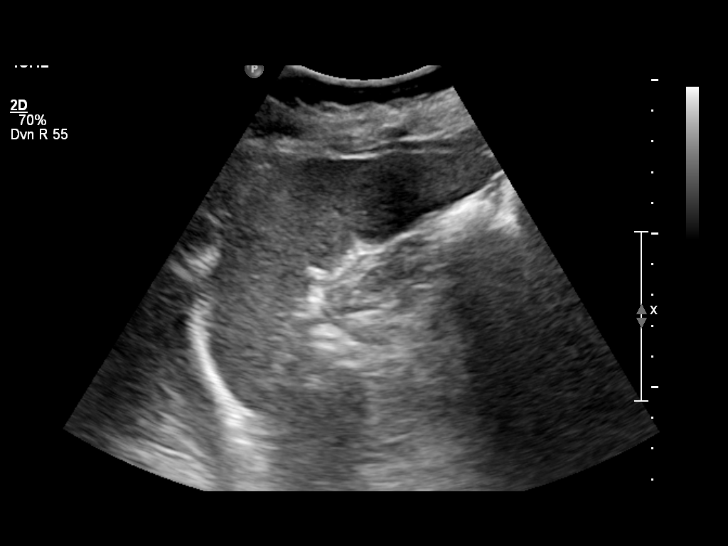
[im 43/47]
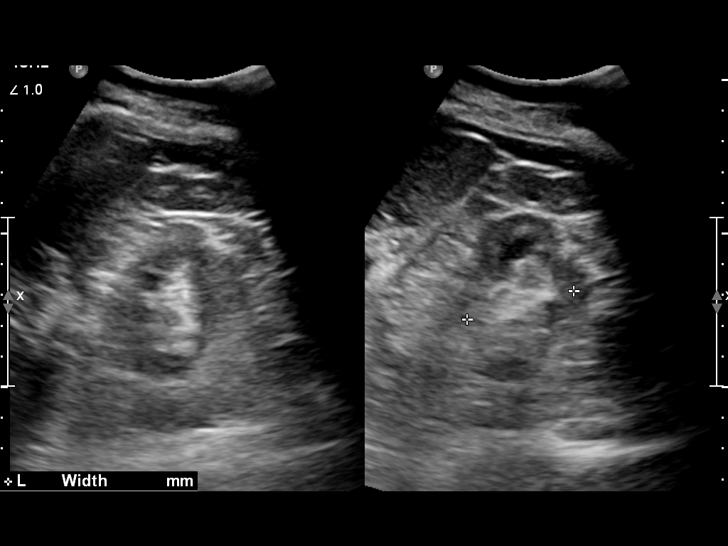
[im 47/47]
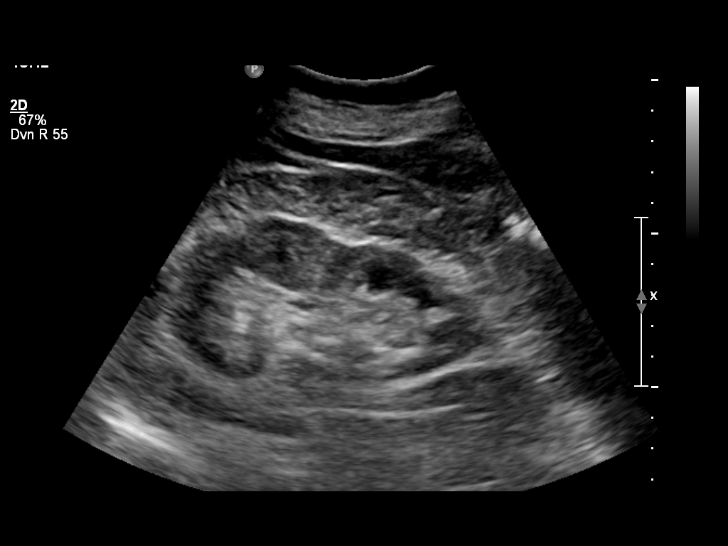

[14 of 25 positions shown; findings below may reference images not displayed]

FINDINGS: The gallbladder is surgically absent.  Extrahepatic bile ducts are normal in size measuring 6 mm at common hepatic duct level. 

Normal size liver and spleen measuring 14 cm and 11 cm respectively in length. Portal vein and hepatic veins are patent.

Aorta, vena cava and the retroperitoneum are normal. Pancreas is incompletely visualized. 

Kidneys are normal in size with moderately increased echotexture of renal cortex consistent with chronic medical renal changes. No obstructive changes. 

No free fluid.
IMPRESSION: 1. Status post cholecystectomy.  Normal size liver and spleen without focal lesions.  Normal size bile ducts.  

2. Normal size kidneys without obstructive changes. Moderately increased echotexture of renal cortex consistent with moderate degree of chronic medical renal changes. No free fluid.

3. Pancreas incompletely visualized due to artifact from overlying bowel gas.

## 2022-11-10 IMAGING — MG 3D DX MAMMO BIL AND TOMO
5 series · 10 of 16 positions shown · non-contrast
Comparison: 12/27/2020

﻿

EXAM:  US RT BREAST COMPLETE- 4 QUADRANTS PLUS AXILLA,3D DX MAMMO BIL AND TOMO
INDICATION: Right lump.

[R]
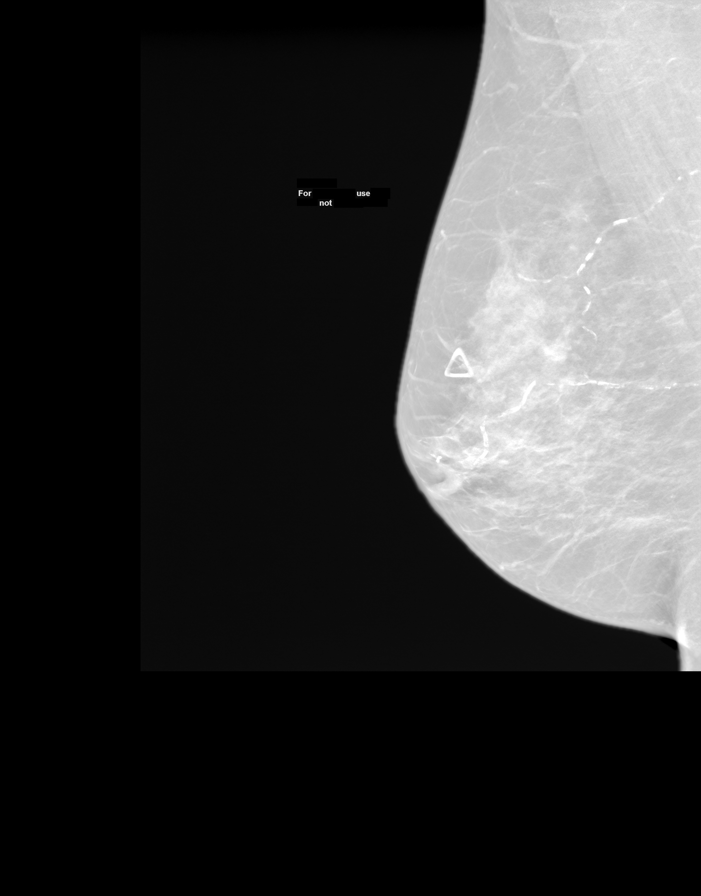

[L]
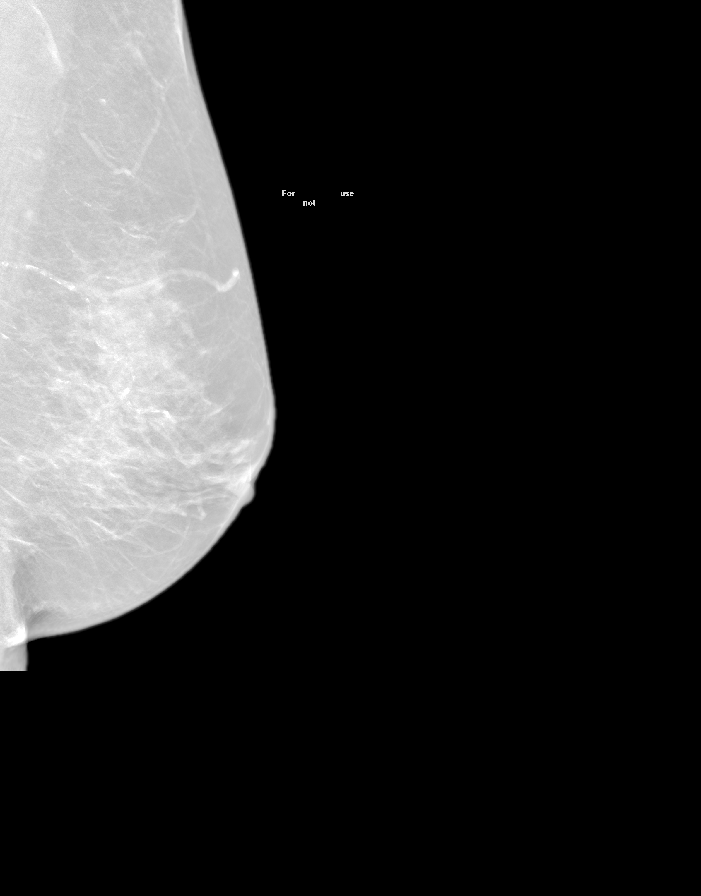

[R CC tomo · right · 0.10mm/px · 4 of 6 slices shown]
[im 1/6]
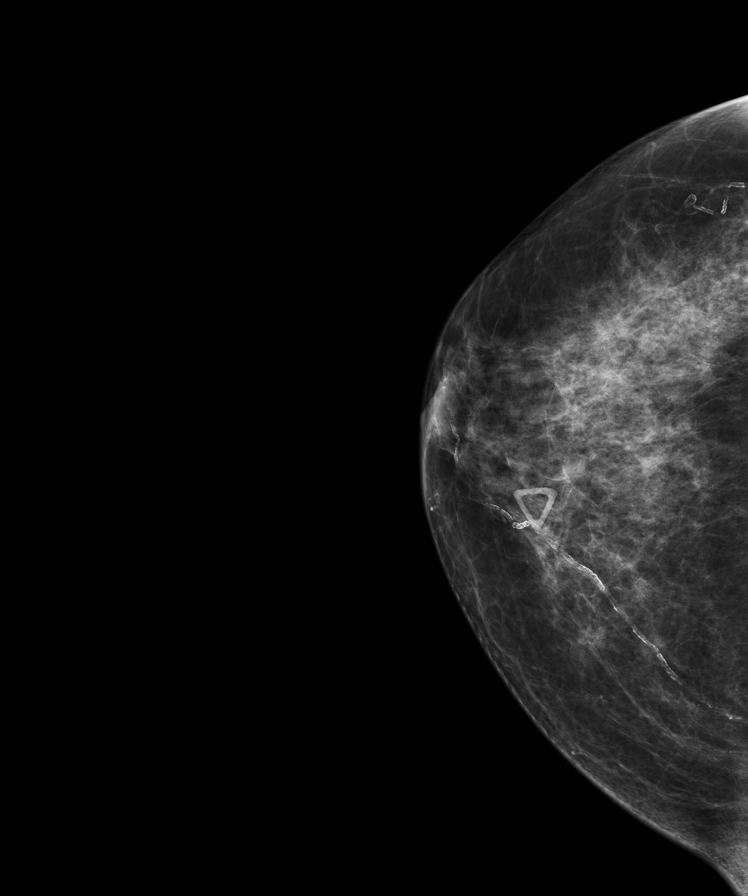
[im 2/6]
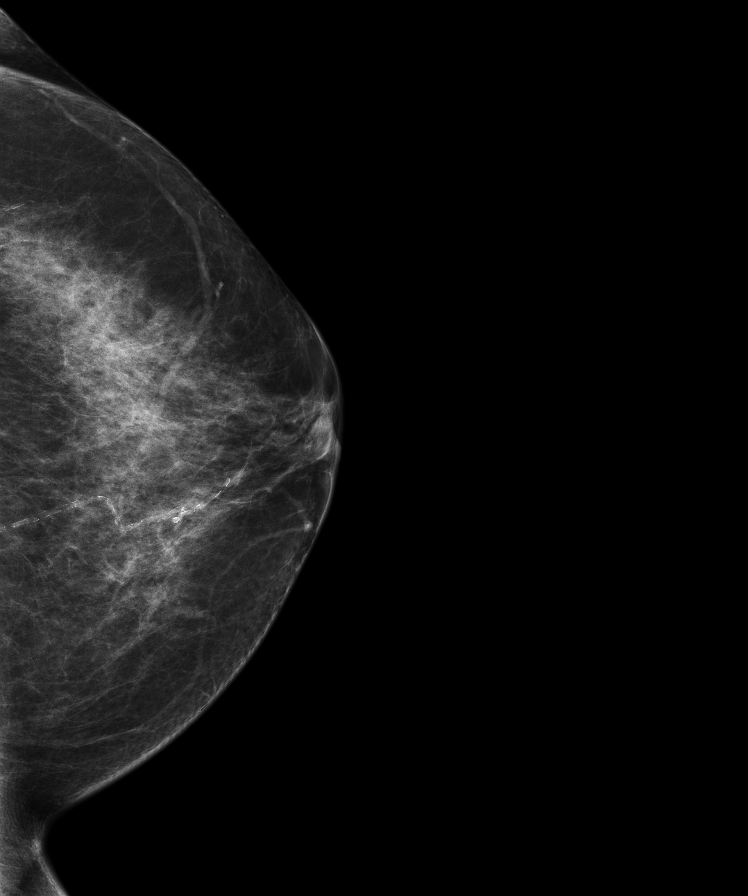
[im 4/6]
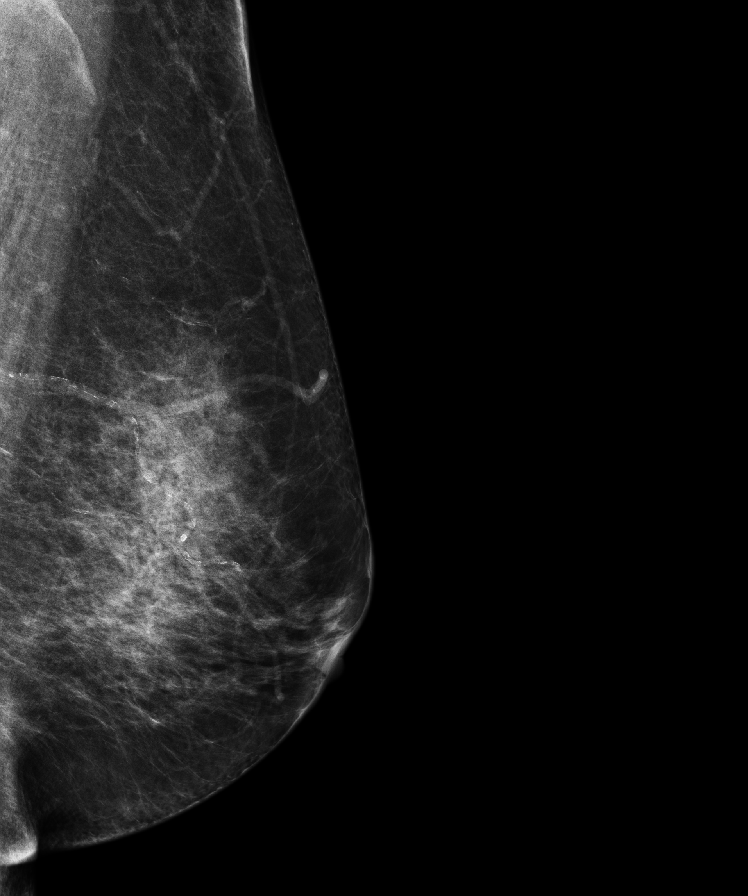
[im 6/6]
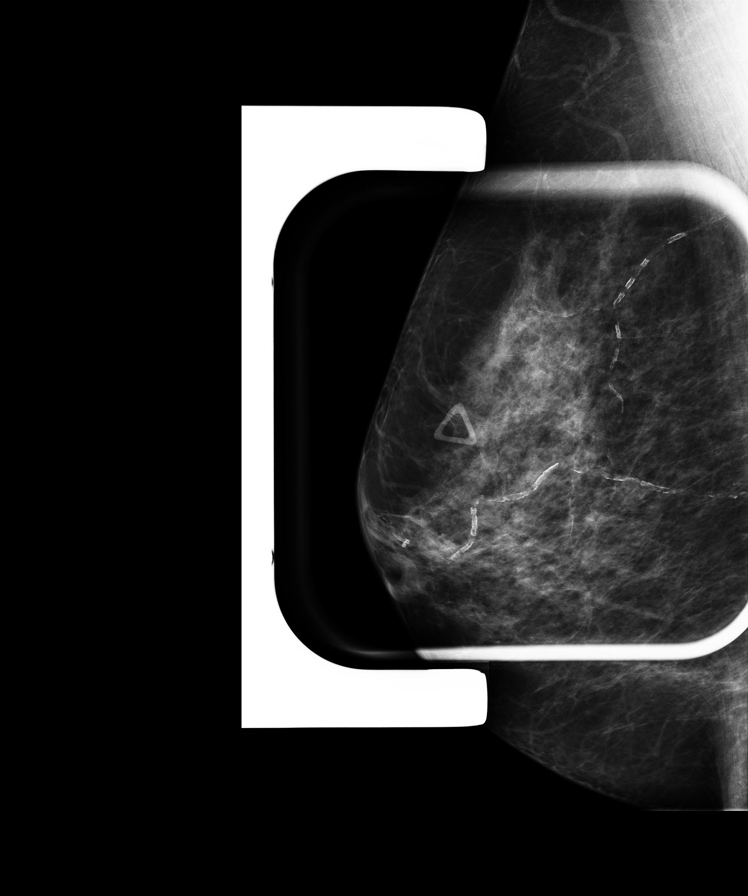

[3D DX MAMMO BIL AND TOMO tomo · 2 acquisitions, 3 frames shown (1 of 2)]
[im 1/2]
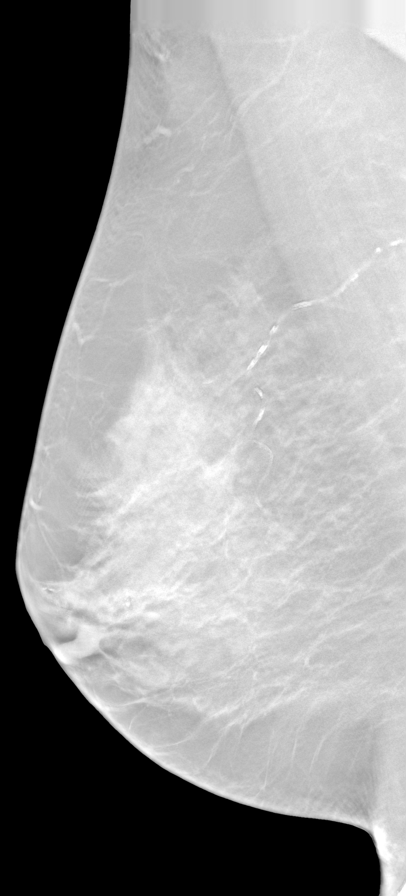
[im 1/2]
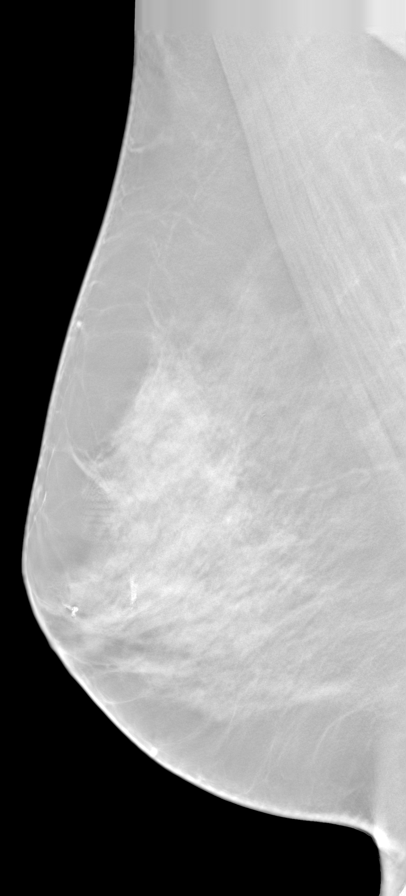
[im 1/2]
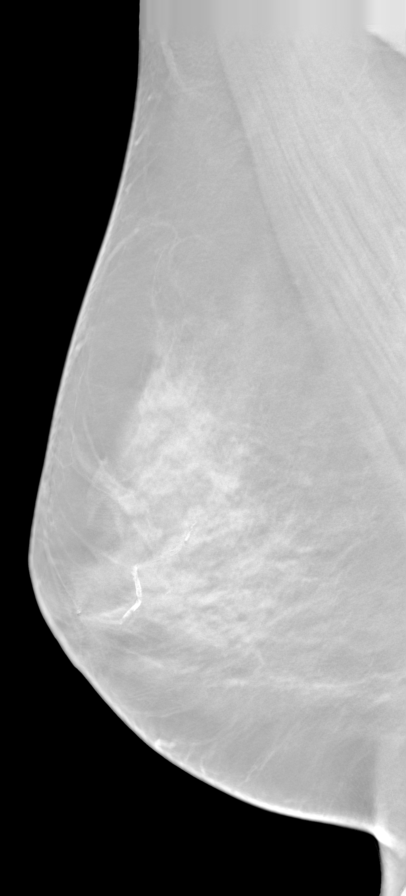

[3D DX MAMMO BIL AND TOMO tomo (2 of 2) · tomo slice 8/15.0]
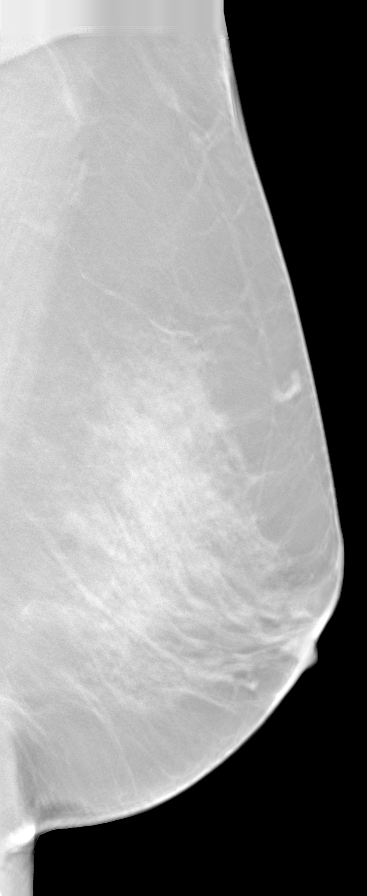

[10 of 16 positions shown; findings below may reference images not displayed]

FINDINGS: Breast parenchyma is heterogeneously dense.  There is no mass or suspicious cluster of microcalcifications.  There is no architectural distortion, skin thickening or nipple retraction. 

Ultrasound examination of the right breast was performed including all four quadrants and the axillary tail.  There is no suspicious mass, cyst, or significant abnormal acoustical shadowing.
IMPRESSION: 1.  No radiographic evidence of malignancy.  

2.  Final overall assessment category BIRADS 2-Benign findings. Patient has been added in a reminder system with a target date for the next screening mammography.

3.  DENSITY CODE –  C (Heterogeneously dense) 

4.  RECOMMENDATION:  Routine annual screening mammography unless other indicated on the basis of clinical parameters.  

Final Assessment Code:

BI-RADS 0
 Need additional imaging evaluation.

BI-RADS 1
 Negative mammogram.

BI-RADS 2
 Benign finding.

BI-RADS 3
 Probably benign finding; short-interval follow-up suggested.

BI-RADS 4
 Suspicious abnormality; biopsy should be considered.

BI-RADS 5
 Highly suggestive of malignancy; appropriate action should be taken.

BI-RADS 6
 Known biopsy-proven malignancy; appropriate action should be taken.

NOTE:
In compliance with Federal regulations, the results of this mammogram are being sent to the patient.

## 2022-11-10 IMAGING — US US RT BREAST COMPLETE- 4 QUADRANTS PLUS AXILLA
1 series · 13 of 25 positions shown · non-contrast
Comparison: 03/02/2023

------------- REPORT [id number] -------------
﻿

EXAM:  US RT BREAST COMPLETE- 4 QUADRANTS PLUS AXILLA,3D DX MAMMO BIL AND TOMO
INDICATION: Right lump.

[Series 1: us right breast complete- 4 quadrants plus axilla · 13 of 51 slices shown]
[im 1/51]
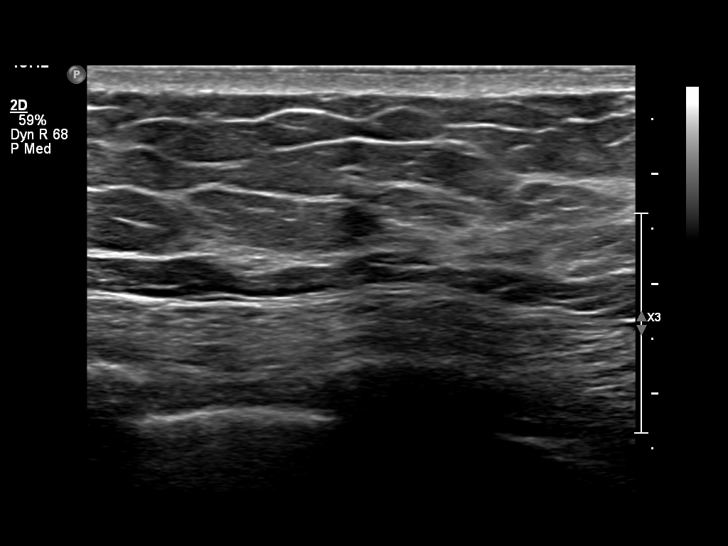
[im 5/51]
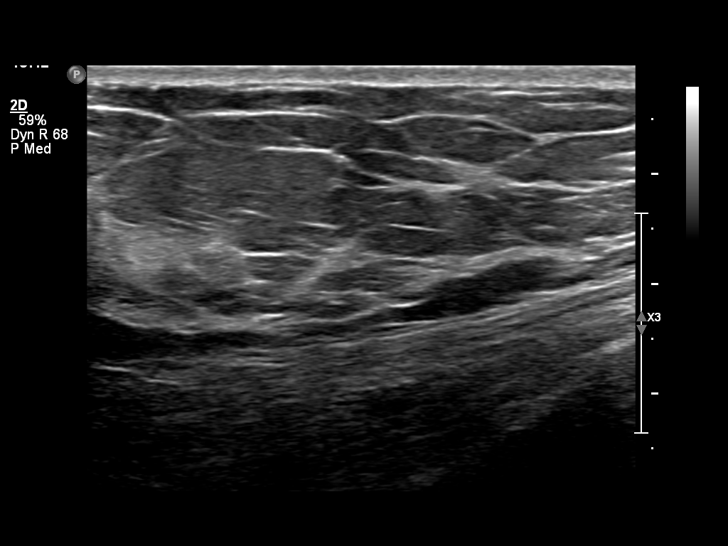
[im 9/51]
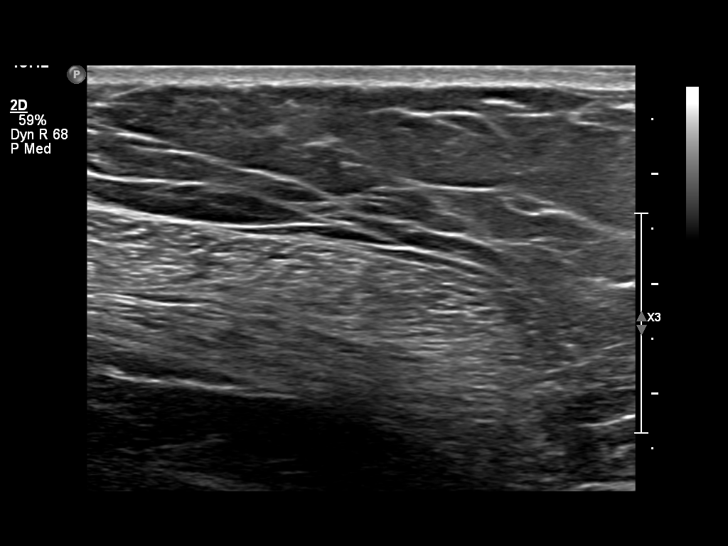
[im 13/51]
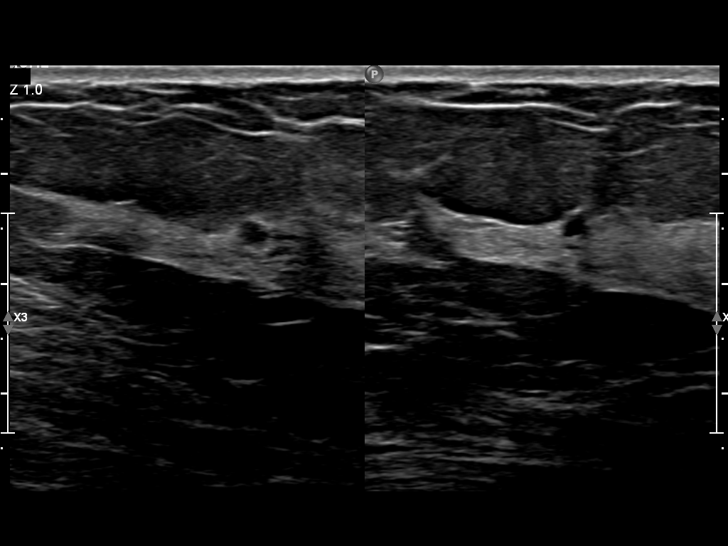
[im 17/51]
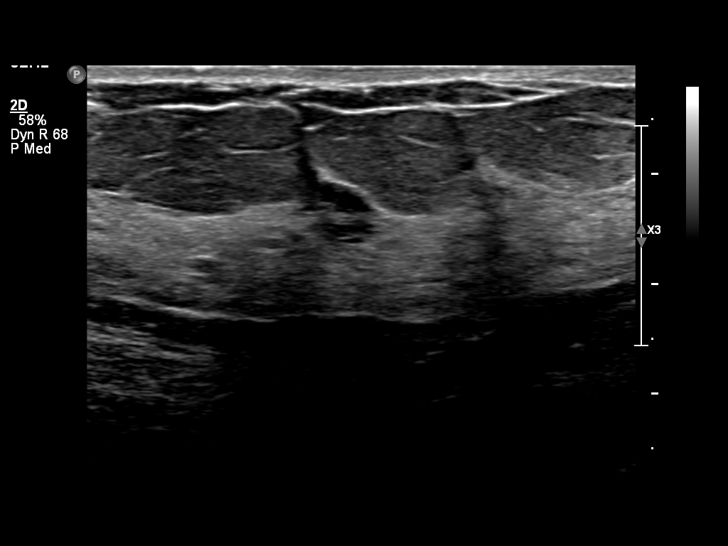
[im 21/51]
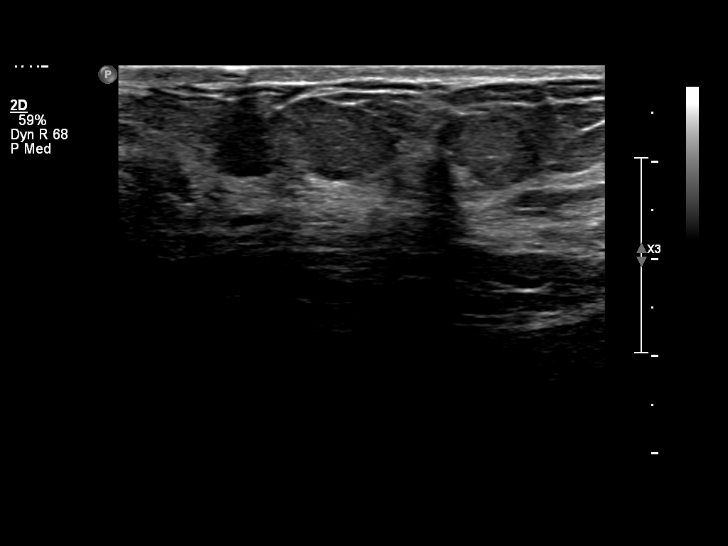
[im 26/51]
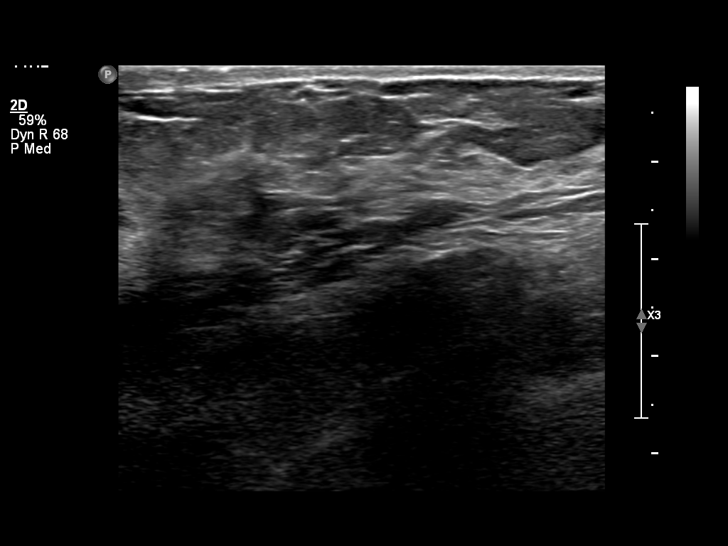
[im 30/51]
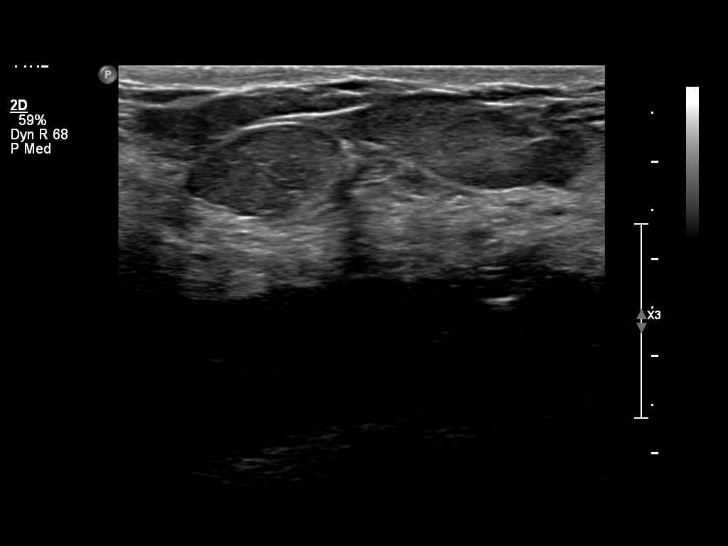
[im 34/51]
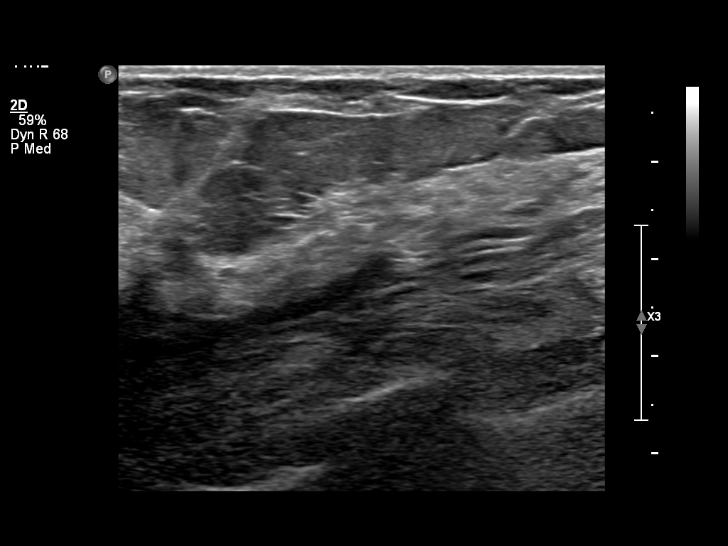
[im 38/51]
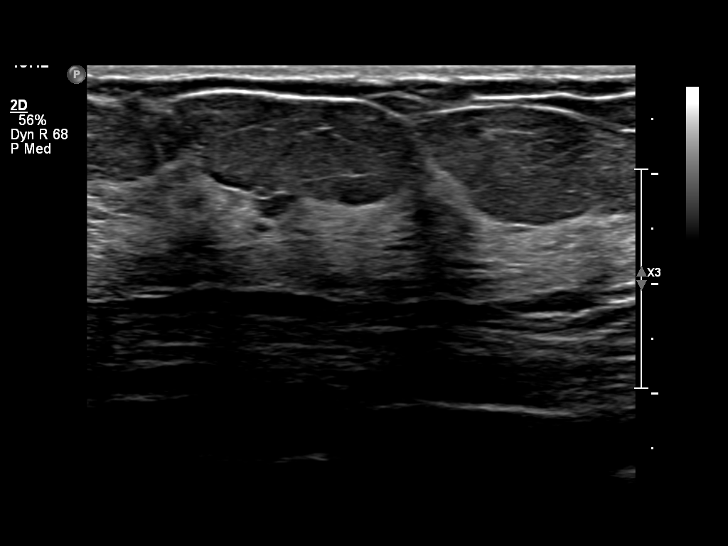
[im 42/51]
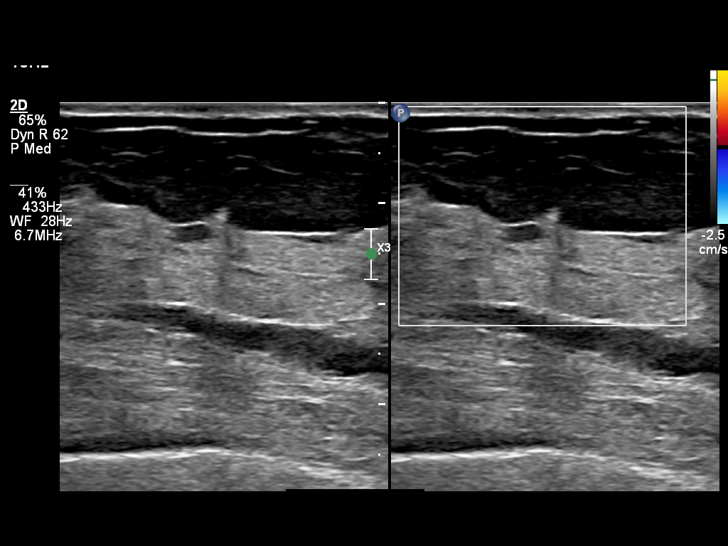
[im 46/51]
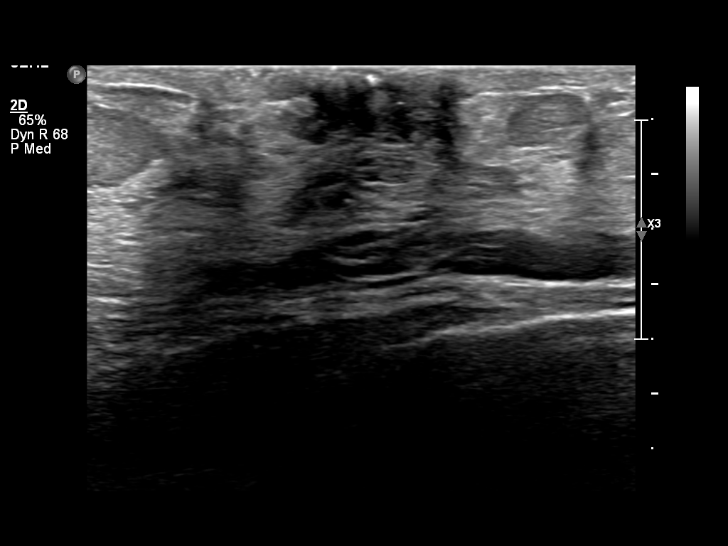
[im 51/51]
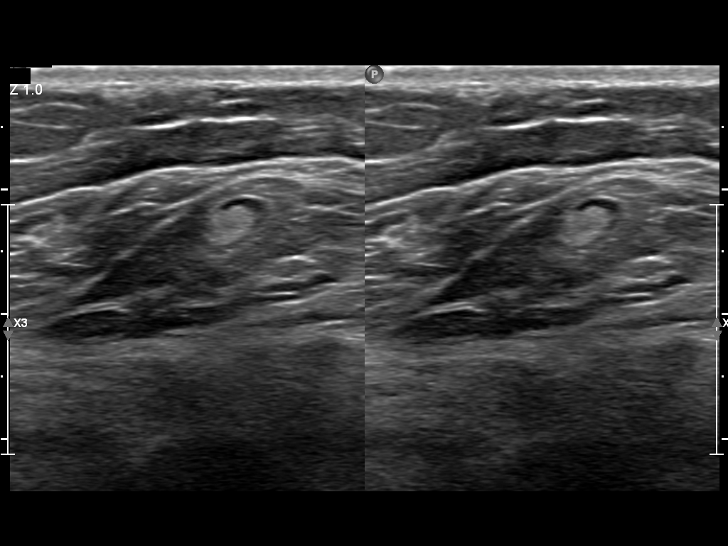

[13 of 25 positions shown; findings below may reference images not displayed]

FINDINGS: Breast parenchyma is heterogeneously dense.  There is no mass or suspicious cluster of microcalcifications.  There is no architectural distortion, skin thickening or nipple retraction. 

Ultrasound examination of the right breast was performed including all four quadrants and the axillary tail.  There is no suspicious mass, cyst, or significant abnormal acoustical shadowing.
IMPRESSION: 1.  No radiographic evidence of malignancy.  

2.  Final overall assessment category BIRADS 2-Benign findings. Patient has been added in a reminder system with a target date for the next screening mammography.

3.  DENSITY CODE –  C (Heterogeneously dense) 

4.  RECOMMENDATION:  Routine annual screening mammography unless other indicated on the basis of clinical parameters.  

Final Assessment Code:

BI-RADS 0
 Need additional imaging evaluation.

BI-RADS 1
 Negative mammogram.

BI-RADS 2
 Benign finding.

BI-RADS 3
 Probably benign finding; short-interval follow-up suggested.

BI-RADS 4
 Suspicious abnormality; biopsy should be considered.

BI-RADS 5
 Highly suggestive of malignancy; appropriate action should be taken.

BI-RADS 6
 Known biopsy-proven malignancy; appropriate action should be taken.

NOTE:
In compliance with Federal regulations, the results of this mammogram are being sent to the patient.

------------- REPORT [id number] -------------
[facility]
[id number]
We wish to report the following on your recent mammography examination. We are sending a report to your referring physician or other health care provider.
FINDING: Normal/Negative-No evidence of cancer.

This statement is mandated by the [place], Department of Health.
Your examination was performed by one of our technologists, who are registered radiological technologists and also specially certified in mammography:
___
Tawanda, Kangeta (M)

Your mammogram was interpreted by our radiologist.

( 
Kuninori Sisido, M.D.

(Annual Breast Examination by a physician or other health care provider
(Annual Mammography Screening beginning at age 40
(Monthly Breast Self Examination
